# Patient Record
Sex: Female | Born: 1965 | Race: White | Hispanic: No | Marital: Married | Smoking: Never smoker
Health system: Southern US, Community
[De-identification: ages and names within clinical notes are randomized; demographics above are authoritative.]

## PROBLEM LIST (undated history)

## (undated) DIAGNOSIS — R519 Headache, unspecified: Secondary | ICD-10-CM

## (undated) DIAGNOSIS — I499 Cardiac arrhythmia, unspecified: Secondary | ICD-10-CM

## (undated) DIAGNOSIS — D649 Anemia, unspecified: Secondary | ICD-10-CM

## (undated) DIAGNOSIS — K219 Gastro-esophageal reflux disease without esophagitis: Secondary | ICD-10-CM

## (undated) DIAGNOSIS — Z8719 Personal history of other diseases of the digestive system: Secondary | ICD-10-CM

## (undated) DIAGNOSIS — G4730 Sleep apnea, unspecified: Secondary | ICD-10-CM

## (undated) DIAGNOSIS — E042 Nontoxic multinodular goiter: Secondary | ICD-10-CM

## (undated) DIAGNOSIS — A6920 Lyme disease, unspecified: Secondary | ICD-10-CM

## (undated) DIAGNOSIS — R51 Headache: Secondary | ICD-10-CM

## (undated) DIAGNOSIS — M1990 Unspecified osteoarthritis, unspecified site: Secondary | ICD-10-CM

## (undated) DIAGNOSIS — Z87442 Personal history of urinary calculi: Secondary | ICD-10-CM

## (undated) DIAGNOSIS — F4310 Post-traumatic stress disorder, unspecified: Secondary | ICD-10-CM

## (undated) DIAGNOSIS — G459 Transient cerebral ischemic attack, unspecified: Secondary | ICD-10-CM

## (undated) DIAGNOSIS — C801 Malignant (primary) neoplasm, unspecified: Secondary | ICD-10-CM

## (undated) HISTORY — PX: NASAL SEPTUM SURGERY: SHX37

## (undated) HISTORY — DX: Gastro-esophageal reflux disease without esophagitis: K21.9

## (undated) HISTORY — PX: ABDOMINAL HYSTERECTOMY: SHX81

## (undated) HISTORY — PX: OTHER SURGICAL HISTORY: SHX169

## (undated) HISTORY — PX: LUMBAR FUSION: SHX111

## (undated) HISTORY — PX: CERVICAL FUSION: SHX112

## (undated) HISTORY — PX: TONSILLECTOMY: SUR1361

## (undated) HISTORY — PX: CHOLECYSTECTOMY: SHX55

---

## 2009-12-09 DIAGNOSIS — G459 Transient cerebral ischemic attack, unspecified: Secondary | ICD-10-CM

## 2009-12-09 DIAGNOSIS — C801 Malignant (primary) neoplasm, unspecified: Secondary | ICD-10-CM

## 2009-12-09 HISTORY — PX: OTHER SURGICAL HISTORY: SHX169

## 2009-12-09 HISTORY — DX: Malignant (primary) neoplasm, unspecified: C80.1

## 2009-12-09 HISTORY — DX: Transient cerebral ischemic attack, unspecified: G45.9

## 2015-12-10 DIAGNOSIS — Z87442 Personal history of urinary calculi: Secondary | ICD-10-CM

## 2015-12-10 HISTORY — DX: Personal history of urinary calculi: Z87.442

## 2016-12-20 ENCOUNTER — Other Ambulatory Visit (HOSPITAL_COMMUNITY): Payer: Self-pay | Admitting: Surgery

## 2016-12-30 ENCOUNTER — Ambulatory Visit (HOSPITAL_COMMUNITY)
Admission: RE | Admit: 2016-12-30 | Discharge: 2016-12-30 | Disposition: A | Discharge status: Home / Self Care | Payer: Medicare Other | Source: Ambulatory Visit | Attending: Surgery | Admitting: Surgery

## 2016-12-30 ENCOUNTER — Other Ambulatory Visit (HOSPITAL_COMMUNITY): Payer: Self-pay | Admitting: Surgery

## 2016-12-30 ENCOUNTER — Other Ambulatory Visit (HOSPITAL_COMMUNITY)
Admission: RE | Admit: 2016-12-30 | Discharge: 2016-12-30 | Disposition: A | Discharge status: Home / Self Care | Payer: Medicare Other | Source: Ambulatory Visit | Attending: Surgery | Admitting: Surgery

## 2016-12-30 ENCOUNTER — Other Ambulatory Visit: Payer: Self-pay

## 2016-12-30 DIAGNOSIS — K219 Gastro-esophageal reflux disease without esophagitis: Secondary | ICD-10-CM | POA: Diagnosis not present

## 2016-12-30 DIAGNOSIS — Z01812 Encounter for preprocedural laboratory examination: Secondary | ICD-10-CM | POA: Insufficient documentation

## 2016-12-30 DIAGNOSIS — I517 Cardiomegaly: Secondary | ICD-10-CM | POA: Insufficient documentation

## 2016-12-30 DIAGNOSIS — Z01818 Encounter for other preprocedural examination: Secondary | ICD-10-CM | POA: Insufficient documentation

## 2016-12-30 LAB — CBC WITH DIFFERENTIAL/PLATELET
Basophils Absolute: 0 10*3/uL (ref 0.0–0.1)
Basophils Relative: 0 %
Eosinophils Absolute: 0.1 10*3/uL (ref 0.0–0.7)
Eosinophils Relative: 1 %
HCT: 37.8 % (ref 36.0–46.0)
Hemoglobin: 12 g/dL (ref 12.0–15.0)
LYMPHS ABS: 2.4 10*3/uL (ref 0.7–4.0)
LYMPHS PCT: 28 %
MCH: 29.1 pg (ref 26.0–34.0)
MCHC: 31.7 g/dL (ref 30.0–36.0)
MCV: 91.7 fL (ref 78.0–100.0)
MONO ABS: 0.7 10*3/uL (ref 0.1–1.0)
MONOS PCT: 8 %
NEUTROS ABS: 5.4 10*3/uL (ref 1.7–7.7)
Neutrophils Relative %: 63 %
Platelets: 295 10*3/uL (ref 150–400)
RBC: 4.12 MIL/uL (ref 3.87–5.11)
RDW: 13.9 % (ref 11.5–15.5)
WBC: 8.6 10*3/uL (ref 4.0–10.5)

## 2016-12-30 LAB — COMPREHENSIVE METABOLIC PANEL
ALT: 18 U/L (ref 14–54)
ANION GAP: 8 (ref 5–15)
AST: 16 U/L (ref 15–41)
Albumin: 3.5 g/dL (ref 3.5–5.0)
Alkaline Phosphatase: 65 U/L (ref 38–126)
BUN: 18 mg/dL (ref 6–20)
CALCIUM: 8.6 mg/dL — AB (ref 8.9–10.3)
CO2: 29 mmol/L (ref 22–32)
Chloride: 103 mmol/L (ref 101–111)
Creatinine, Ser: 0.98 mg/dL (ref 0.44–1.00)
GFR calc Af Amer: >60 mL/min (ref >60)
Glucose, Bld: 90 mg/dL (ref 65–99)
POTASSIUM: 4.2 mmol/L (ref 3.5–5.1)
Sodium: 140 mmol/L (ref 135–145)
TOTAL PROTEIN: 6.6 g/dL (ref 6.5–8.1)
Total Bilirubin: 0.7 mg/dL (ref 0.3–1.2)

## 2016-12-30 LAB — IRON: IRON: 39 ug/dL (ref 28–170)

## 2016-12-30 LAB — LIPID PANEL
Cholesterol: 209 mg/dL — ABNORMAL HIGH (ref 0–200)
HDL: 71 mg/dL (ref >40)
LDL Cholesterol: 113 mg/dL — ABNORMAL HIGH (ref 0–99)
Total CHOL/HDL Ratio: 2.9 RATIO
Triglycerides: 126 mg/dL (ref <150)
VLDL: 25 mg/dL (ref 0–40)

## 2016-12-30 LAB — TSH: TSH: 0.275 u[IU]/mL — AB (ref 0.350–4.500)

## 2016-12-30 LAB — VITAMIN B12: Vitamin B-12: 772 pg/mL (ref 180–914)

## 2016-12-31 LAB — HEMOGLOBIN A1C
HEMOGLOBIN A1C: 5.4 % (ref 4.8–5.6)
Mean Plasma Glucose: 108 mg/dL

## 2016-12-31 LAB — VITAMIN D 25 HYDROXY (VIT D DEFICIENCY, FRACTURES): Vit D, 25-Hydroxy: 38.2 ng/mL (ref 30.0–100.0)

## 2016-12-31 LAB — MISC LABCORP TEST (SEND OUT): Labcorp test code: 163170

## 2017-01-01 LAB — VITAMIN B1: Vitamin B1 (Thiamine): 151.1 nmol/L (ref 66.5–200.0)

## 2017-01-03 LAB — VITAMIN D 1,25 DIHYDROXY
VITAMIN D3 1, 25 (OH): 37 pg/mL
Vitamin D 1, 25 (OH)2 Total: 38 pg/mL

## 2017-01-08 ENCOUNTER — Encounter: Payer: Self-pay | Admitting: Skilled Nursing Facility1

## 2017-01-08 ENCOUNTER — Encounter: Payer: Medicare Other | Attending: Surgery | Admitting: Skilled Nursing Facility1

## 2017-01-08 DIAGNOSIS — Z79899 Other long term (current) drug therapy: Secondary | ICD-10-CM | POA: Diagnosis not present

## 2017-01-08 DIAGNOSIS — E669 Obesity, unspecified: Secondary | ICD-10-CM

## 2017-01-08 DIAGNOSIS — Z6841 Body Mass Index (BMI) 40.0 and over, adult: Secondary | ICD-10-CM | POA: Diagnosis not present

## 2017-01-08 DIAGNOSIS — Z85528 Personal history of other malignant neoplasm of kidney: Secondary | ICD-10-CM | POA: Diagnosis not present

## 2017-01-08 DIAGNOSIS — Z905 Acquired absence of kidney: Secondary | ICD-10-CM | POA: Insufficient documentation

## 2017-01-08 DIAGNOSIS — Z713 Dietary counseling and surveillance: Secondary | ICD-10-CM | POA: Insufficient documentation

## 2017-01-08 DIAGNOSIS — Z8673 Personal history of transient ischemic attack (TIA), and cerebral infarction without residual deficits: Secondary | ICD-10-CM | POA: Diagnosis not present

## 2017-01-08 DIAGNOSIS — K219 Gastro-esophageal reflux disease without esophagitis: Secondary | ICD-10-CM | POA: Diagnosis not present

## 2017-01-08 DIAGNOSIS — G4730 Sleep apnea, unspecified: Secondary | ICD-10-CM | POA: Diagnosis not present

## 2017-01-08 NOTE — Patient Instructions (Signed)
Follow Pre-Op Goals Try Protein Shakes Call [facility] at [phone] when surgery is scheduled to enroll in Pre-Op Class  Things to remember:  Please always be honest with [place]. We want to support you!  If you have any questions or concerns in between appointments, please call or email   The diet after surgery will be high protein and low in carbohydrate.  Vitamins and calcium need to be taken for the rest of your life.  Feel free to include support people in any classes or appointments.   Supplement recommendations:  Before Surgery   1 Complete Multivitamin with Iron  3000 IU Vitamin D3  After Surgery   2 Chewable Multivitamins  **Best Choice - Bariatric Advantage Advanced Multi EA      3 Chewable Calcium (500 mg each, total 1200-1500 mg per day)  **Best Choice - Celebrate, Bariatric Advantage, or Wellesse  Other Options:    2 Flinstones Complete + up to 100 mg Thiamin + 2000-3000 IU Vitamin D3 + 350-500 mcg Vitamin B12 + 30-45 mg Iron (with history of deficiency)  2 Celebrate MultiComplete with 18 mg Iron (this provides 6000 IU of  Vitamin D3)  4 Celebrate Essential Multi 2 in 1 (has calcium) + 18-60 mg separate  iron  Vitamins and Calcium are available at:   [facility]   [address] [place], [place] [address]   [email or web address]  [email or web address]  www.amazon.com

## 2017-01-08 NOTE — Progress Notes (Signed)
Pre-Op Assessment Visit:  Pre-Operative Sleeve Gastrectomy Surgery  Medical Nutrition Therapy:  Appt start time: 3:15  End time:  4:00  Patient was seen on 01/08/2017 nfor Pre-Operative Nutrition Assessment. Assessment and letter of approval faxed to [facility] Bariatric Surgery Program coordinator on 01/08/2017.  Pt states she has severe GERD and is concerned about the sleeve gastrectomy being the right choice for her so she will ask her surgeon about this. 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 Pt states she had lyme disease and [place] spotted fever. Pt states she has had a stroke. Pt states she was really nervous for this appointment because she did not know what to expect. Pt chose a couple things to work on for her next appointment.  Start weight at [facility]: 261.9 pounds Ht: 63.75 in BMI: 45.31  24 hr Dietary Recall: First Meal: oatmeal----cereal Snack:  Second Meal: tomato sandwich Snack: carrots Third Meal: baked chicken Snack: nutter butter Beverages: water  Encouraged to engage in 45 minutes of moderate physical activity including cardiovascular and weight baring weekly  Handouts given during visit include:  . Pre-Op Goals . Bariatric Surgery Protein Shakes During the appointment today the following Pre-Op Goals were reviewed with the patient: . Maintain or lose weight as instructed by your surgeon . Make healthy food choices . Begin to limit portion sizes . Limited concentrated sugars and fried foods . Keep fat/sugar in the single digits per serving on          food labels . Practice CHEWING your food  (aim for 30 chews per bite or until applesauce consistency) . Practice not drinking 15 minutes before, during, and 30 minutes after each meal/snack . Avoid all carbonated beverages  . Avoid/limit caffeinated beverages  . Avoid all sugar-sweetened beverages . Consume 3 meals per day; eat every 3-5 hours . Make a list of non-food related  activities . Aim for 64-100 ounces of FLUID daily  . Aim for at least 60-80 grams of PROTEIN daily . Look for a liquid protein source that contain ?15 g protein and ?5 g carbohydrate  (ex: shakes, drinks, shots)  -Follow diet recommendations listed below   Energy and Macronutrient Recomendations: Calories: 1500 Carbohydrate: 170 Protein: 112 Fat: 42  Demonstrated degree of understanding via:  Teach Back  Teaching Method Utilized:  Visual Auditory Hands on  Barriers to learning/adherence to lifestyle change: none identified   Patient to call the [facility] to enroll in Pre-Op and Post-Op Nutrition Education when surgery date is scheduled.

## 2017-02-05 ENCOUNTER — Encounter: Payer: Medicare Other | Attending: Surgery | Admitting: Skilled Nursing Facility1

## 2017-02-05 ENCOUNTER — Encounter: Payer: Self-pay | Admitting: Skilled Nursing Facility1

## 2017-02-05 DIAGNOSIS — G4730 Sleep apnea, unspecified: Secondary | ICD-10-CM | POA: Insufficient documentation

## 2017-02-05 DIAGNOSIS — Z85528 Personal history of other malignant neoplasm of kidney: Secondary | ICD-10-CM | POA: Insufficient documentation

## 2017-02-05 DIAGNOSIS — Z905 Acquired absence of kidney: Secondary | ICD-10-CM | POA: Diagnosis not present

## 2017-02-05 DIAGNOSIS — Z8673 Personal history of transient ischemic attack (TIA), and cerebral infarction without residual deficits: Secondary | ICD-10-CM | POA: Insufficient documentation

## 2017-02-05 DIAGNOSIS — Z713 Dietary counseling and surveillance: Secondary | ICD-10-CM | POA: Diagnosis not present

## 2017-02-05 DIAGNOSIS — Z79899 Other long term (current) drug therapy: Secondary | ICD-10-CM | POA: Diagnosis not present

## 2017-02-05 DIAGNOSIS — E669 Obesity, unspecified: Secondary | ICD-10-CM

## 2017-02-05 DIAGNOSIS — K219 Gastro-esophageal reflux disease without esophagitis: Secondary | ICD-10-CM | POA: Insufficient documentation

## 2017-02-05 DIAGNOSIS — Z6841 Body Mass Index (BMI) 40.0 and over, adult: Secondary | ICD-10-CM | POA: Diagnosis not present

## 2017-02-05 NOTE — Progress Notes (Signed)
  Start weight at [facility]: 261.9 pounds Wt: 255.12.8 BMI: 45.31  Assessment:   1st [facility] Appointment. Pt arrives losing about 5 pounds. Pt states she has been working on chewing, staying away from fried foods and trying to increase water intake. Pt staes she can get in 2 16.9 ounces of water in. avoid fried food gone well and bought premier protein. Chewing has been going well. Thinking about changeing to bypass due to GERD.  Pt states she has stopped a Medication causing increased weight gain.  MEDICATIONS: see list   DIETARY INTAKE:  24-hr recall:  B ( AM): oatmeal or egg sandwich Snk ( AM): banana  L ( PM):  Snk ( PM):  D ( PM): grilled steak Snk ( PM):  Beverages: water, sprite  Usual physical activity: treadmill and bike at home: 6 minutes 3 days a week   Diet to Follow: 1500 calories 170 g carbohydrates 112 g protein 42 g fat   Nutritional Diagnosis:  [place]-3.3 Overweight/obesity related to past poor dietary habits and physical inactivity as evidenced by patient w/ recent sleeve gastrectomy surgery following dietary guidelines for continued weight loss.    Intervention:  Nutrition counseling for upcoming Bariatric Surgery. Goals: -Keep working on chewing  -Keep working on adding the water -Keep your activity up -Decrease/cut out all sugar sweetened/carbonated beverages  Teaching Method Utilized:  Visual Auditory Hands on Barriers to learning/adherence to lifestyle change: none identified   Demonstrated degree of understanding via:  Teach Back   Monitoring/Evaluation:  Dietary intake, exercise, and body weight prn.

## 2017-02-05 NOTE — Patient Instructions (Signed)
-  Keep working on chewing   -Keep working on adding the water  -Keep your activity up  -Decrease/cut out all sugar sweetened/carbonated beverages

## 2017-03-06 ENCOUNTER — Encounter: Payer: Medicare Other | Attending: Surgery | Admitting: Skilled Nursing Facility1

## 2017-03-06 ENCOUNTER — Encounter: Payer: Self-pay | Admitting: Skilled Nursing Facility1

## 2017-03-06 DIAGNOSIS — Z905 Acquired absence of kidney: Secondary | ICD-10-CM | POA: Diagnosis not present

## 2017-03-06 DIAGNOSIS — Z713 Dietary counseling and surveillance: Secondary | ICD-10-CM | POA: Insufficient documentation

## 2017-03-06 DIAGNOSIS — Z79899 Other long term (current) drug therapy: Secondary | ICD-10-CM | POA: Diagnosis not present

## 2017-03-06 DIAGNOSIS — K219 Gastro-esophageal reflux disease without esophagitis: Secondary | ICD-10-CM | POA: Insufficient documentation

## 2017-03-06 DIAGNOSIS — Z8673 Personal history of transient ischemic attack (TIA), and cerebral infarction without residual deficits: Secondary | ICD-10-CM | POA: Diagnosis not present

## 2017-03-06 DIAGNOSIS — Z85528 Personal history of other malignant neoplasm of kidney: Secondary | ICD-10-CM | POA: Diagnosis not present

## 2017-03-06 DIAGNOSIS — G4730 Sleep apnea, unspecified: Secondary | ICD-10-CM | POA: Diagnosis not present

## 2017-03-06 DIAGNOSIS — Z6841 Body Mass Index (BMI) 40.0 and over, adult: Secondary | ICD-10-CM | POA: Insufficient documentation

## 2017-03-06 DIAGNOSIS — E669 Obesity, unspecified: Secondary | ICD-10-CM

## 2017-03-06 NOTE — Progress Notes (Signed)
  Start weight at [facility]: 261.9 pounds Wt: 254.3 BMI: 43.99  Assessment:   2nd SWL Appointment. Pt arrives having maintained weight. Pt states she has quit all carbonated drinks. Pt states she has replaced sweets with fresh fruit. Pt states that she has experienced GERD symptoms more this wk. Pt states she has increased her movement. Pt states that she thinks she may have 4 visits left. Pt states that she has increased her physical activity from 6 min to 11 min. Pt states that she will call [facility] to see if they will submit to her insurance at this point.  MEDICATIONS: see list   DIETARY INTAKE:  24-hr recall:  B ( AM): oatmeal or egg sandwich Snk ( AM): banana L ( PM): sandwich Snk ( PM): fruit D ( PM): grilled steak Snk ( PM): grapes, strawberries Beverages: water, cherry or apple juice  Usual physical activity: treadmill and bike at home: 11 minutes 5 days a week   Diet to Follow: 1500 calories 170 g carbohydrates 112 g protein 42 g fat   Nutritional Diagnosis:  [place]-3.3 Overweight/obesity related to past poor dietary habits and physical inactivity as evidenced by patient w/ recent sleeve gastrectomy surgery following dietary guidelines for continued weight loss.    Intervention:  Nutrition counseling for upcoming Bariatric Surgery. Goals: - Decrease intake of cherry juice and monitor GERD to improve symptoms - Try alkaline water - Increase to 15-20 minutes on the treadmill - Totally eliminate juice from diet  Teaching Method Utilized:  Visual Auditory Hands on  Barriers to learning/adherence to lifestyle change: none identified   Demonstrated degree of understanding via:  Teach Back   Monitoring/Evaluation:  Dietary intake, exercise, and body weight prn.

## 2017-03-06 NOTE — Patient Instructions (Addendum)
-   Decrease intake of cherry juice and monitor GERD to improve symptoms - Try alkaline water - Increase to 15-20 minutes on the treadmill - Totally eliminate juice from diet

## 2017-03-27 ENCOUNTER — Ambulatory Visit (INDEPENDENT_AMBULATORY_CARE_PROVIDER_SITE_OTHER): Payer: Medicare Other | Admitting: Psychiatry

## 2017-03-27 DIAGNOSIS — F509 Eating disorder, unspecified: Secondary | ICD-10-CM | POA: Diagnosis not present

## 2017-03-27 DIAGNOSIS — F4310 Post-traumatic stress disorder, unspecified: Secondary | ICD-10-CM

## 2017-04-01 ENCOUNTER — Encounter: Payer: Medicare Other | Attending: Surgery | Admitting: Registered"

## 2017-04-01 ENCOUNTER — Ambulatory Visit (INDEPENDENT_AMBULATORY_CARE_PROVIDER_SITE_OTHER): Payer: Medicare Other | Admitting: Psychiatry

## 2017-04-01 ENCOUNTER — Encounter: Payer: Self-pay | Admitting: Registered"

## 2017-04-01 DIAGNOSIS — F509 Eating disorder, unspecified: Secondary | ICD-10-CM

## 2017-04-01 DIAGNOSIS — Z713 Dietary counseling and surveillance: Secondary | ICD-10-CM | POA: Insufficient documentation

## 2017-04-01 DIAGNOSIS — F4310 Post-traumatic stress disorder, unspecified: Secondary | ICD-10-CM | POA: Diagnosis not present

## 2017-04-01 DIAGNOSIS — Z905 Acquired absence of kidney: Secondary | ICD-10-CM | POA: Insufficient documentation

## 2017-04-01 DIAGNOSIS — K219 Gastro-esophageal reflux disease without esophagitis: Secondary | ICD-10-CM | POA: Insufficient documentation

## 2017-04-01 DIAGNOSIS — Z8673 Personal history of transient ischemic attack (TIA), and cerebral infarction without residual deficits: Secondary | ICD-10-CM | POA: Insufficient documentation

## 2017-04-01 DIAGNOSIS — Z85528 Personal history of other malignant neoplasm of kidney: Secondary | ICD-10-CM | POA: Insufficient documentation

## 2017-04-01 DIAGNOSIS — Z6841 Body Mass Index (BMI) 40.0 and over, adult: Secondary | ICD-10-CM | POA: Insufficient documentation

## 2017-04-01 DIAGNOSIS — E669 Obesity, unspecified: Secondary | ICD-10-CM

## 2017-04-01 DIAGNOSIS — G4730 Sleep apnea, unspecified: Secondary | ICD-10-CM | POA: Insufficient documentation

## 2017-04-01 DIAGNOSIS — Z79899 Other long term (current) drug therapy: Secondary | ICD-10-CM | POA: Insufficient documentation

## 2017-04-01 NOTE — Patient Instructions (Signed)
-   Continue to increase physical activity on treadmill 15-20 minutes 4 times a wk  - Increase non-starchy vegetables daily (greens, cabbage, asparagus, carrots)  - Continue to increase fluid continue to 64 oz per day

## 2017-04-01 NOTE — Progress Notes (Signed)
Appt start time: 3:50 end time: 4:24 Assessment: 3rd SWL Appointment   Start Wt at [facility]: 261.9 Wt: 253.1 BMI: 43.79  Pt arrives having lost about 1 lb from previous visit. Pt admits she's eating well but feeling very tired. Pt reports she has eliminated cherry juice and other juices from diet and GERD has improved. Pt states she has maintained amount of time on treadmill but has added in strength training.   Pt states that she needs 2 more visits prior to surgery.   Preferred Learning Style:   Auditory  Visual  No preference indicated   Learning Readiness:   Ready  Change in progress  MEDICATIONS: See medication list   DIETARY INTAKE:  24-hr recall:  B ( AM): 1/2 egg, burrito Snk ( AM): grapes, goldfish  L ( PM): 1/2 bowl tuna, egg, ritz crackers Snk ( PM): peanut butter sandwich D ( PM): taco w/ lettuce rice and beans Snk ( PM): grapes Beverages: water  Usual physical activity: 4 times per week with treadmill  Diet to Follow: 1500 calories 170 g carbohydrates 112 g protein 42 g fat   Nutritional Diagnosis:  [place]-3.3 Overweight/obesity related to past poor dietary habits and physical inactivity as evidenced by patient w/ recent sleeve gastrectomy surgery following dietary guidelines for continued weight loss.    Intervention:  Nutrition counseling for upcoming Bariatric Surgery.  Goals:  - Continue to increase physical activity on treadmill 15-20 minutes 4 times a wk - Increase non-starchy vegetables daily (greens, cabbage, asparagus, carrots) - Continue to increase fluid continue to 64 oz per day  Teaching Method Utilized:  Visual Auditory  Handouts given during visit include: none  Barriers to learning/adherence to lifestyle change: none identified  Demonstrated degree of understanding via: Teach Back   Monitoring/Evaluation:  Dietary intake, exercise, and body weight in 1 month(s).

## 2017-04-02 ENCOUNTER — Ambulatory Visit: Payer: Medicare Other | Admitting: Registered"

## 2017-04-23 ENCOUNTER — Encounter: Payer: Medicare Other | Attending: Surgery | Admitting: Registered"

## 2017-04-23 ENCOUNTER — Encounter: Payer: Self-pay | Admitting: Registered"

## 2017-04-23 DIAGNOSIS — E669 Obesity, unspecified: Secondary | ICD-10-CM

## 2017-04-23 DIAGNOSIS — Z79899 Other long term (current) drug therapy: Secondary | ICD-10-CM | POA: Diagnosis not present

## 2017-04-23 DIAGNOSIS — K219 Gastro-esophageal reflux disease without esophagitis: Secondary | ICD-10-CM | POA: Diagnosis not present

## 2017-04-23 DIAGNOSIS — Z6841 Body Mass Index (BMI) 40.0 and over, adult: Secondary | ICD-10-CM | POA: Diagnosis not present

## 2017-04-23 DIAGNOSIS — Z713 Dietary counseling and surveillance: Secondary | ICD-10-CM | POA: Diagnosis not present

## 2017-04-23 DIAGNOSIS — Z905 Acquired absence of kidney: Secondary | ICD-10-CM | POA: Diagnosis not present

## 2017-04-23 DIAGNOSIS — G4730 Sleep apnea, unspecified: Secondary | ICD-10-CM | POA: Insufficient documentation

## 2017-04-23 DIAGNOSIS — Z85528 Personal history of other malignant neoplasm of kidney: Secondary | ICD-10-CM | POA: Diagnosis not present

## 2017-04-23 DIAGNOSIS — Z8673 Personal history of transient ischemic attack (TIA), and cerebral infarction without residual deficits: Secondary | ICD-10-CM | POA: Diagnosis not present

## 2017-04-23 NOTE — Progress Notes (Signed)
Appt start time: 4:30 end time: 4:49 Assessment: 4th SWL Appointment   Start Wt at [facility]: 261.9 Wt: 253.6 BMI: 43.79   Pt arrives having maintained weight from previous visit. Pt states she thinks she injured her shoulder working out on a Engineer, water. Pt states her injuries have hindered her from being able to workout. Pt reports currently getting infusions in her neck and back. Pt states she has not been able to use treadmill as well. Pt reports cutting out all sweets except juices. Pt reports she hasn't been adding in many vegetables yet.   Pt states at her last visit, set up pre-op class. Pt states that she needs 1 more visits prior to surgery and will verify.  Preferred Learning Style:   Auditory  Visual  No preference indicated   Learning Readiness:   Ready  Change in progress  MEDICATIONS: See medication list   DIETARY INTAKE:  24-hr recall:  B ( AM): scrambled eggs, [place], onion Snk ( AM): grapes, goldfish  L ( PM): 1/2 bowl tuna, ritz crackers Snk ( PM): none D ( PM): flounder filet, cole slaw, cheese toast Snk ( PM): none Beverages: water, juice  Usual physical activity: 4 times per week with treadmill  Diet to Follow: 1500 calories 170 g carbohydrates 112 g protein 42 g fat   Nutritional Diagnosis:  [place]-3.3 Overweight/obesity related to past poor dietary habits and physical inactivity as evidenced by patient w/ upcoming sleeve gastrectomy surgery following dietary guidelines for continued weight loss.    Intervention:  Nutrition counseling for upcoming Bariatric Surgery.  Goals:  - Continue to aim for 64 oz of fluid a day. - Aim to increase physical activity once shoulder pain improves.    Teaching Method Utilized:  Visual Auditory  Handouts given during visit include: none  Barriers to learning/adherence to lifestyle change: none identified  Demonstrated degree of understanding via: Teach Back   Monitoring/Evaluation:  Dietary  intake, exercise, and body weight in 21 day(s).

## 2017-04-23 NOTE — Patient Instructions (Addendum)
-   Continue to aim for 64 oz of fluid a day.  - Aim to increase physical activity once shoulder pain improves.

## 2017-04-28 ENCOUNTER — Encounter: Payer: Medicare Other | Admitting: Skilled Nursing Facility1

## 2017-04-28 ENCOUNTER — Encounter: Payer: Self-pay | Admitting: Skilled Nursing Facility1

## 2017-04-28 DIAGNOSIS — E669 Obesity, unspecified: Secondary | ICD-10-CM

## 2017-04-28 NOTE — Progress Notes (Signed)
Please place orders in EPIC as patient is being scheduled for a pre-op appointment! Thank you! 

## 2017-04-28 NOTE — Progress Notes (Signed)
  Pre-Operative Nutrition Class:  Appt start time: 3893   End time:  1830.  Patient was seen on 04/28/17 for Pre-Operative Bariatric Surgery [facility].   Surgery date:  Surgery type: Sleeve Start weight at [facility]: 261 lbs Weight today: 255 lbs  TANITA  BODY COMP RESULTS     BMI (kg/m^2)    Fat Mass (lbs)    Fat Free Mass (lbs)    Total Body Water (lbs)    Samples given per MNT protocol. Patient educated on appropriate usage: Bariatric Advantage Multivitamin Lot # [id number] Exp: 06/19  Bariatric Advantage Calcium Citrate Lot # [id number] Exp: 08/17/17  Premier Protein Lot # [id number] Exp: 10/11/17  The following the learning objectives were met by the patient during this course:  Identify Pre-Op Dietary Goals and will begin 2 weeks pre-operatively  Identify appropriate sources of fluids and proteins   State protein recommendations and appropriate sources pre and post-operatively  Identify Post-Operative Dietary Goals and will follow for 2 weeks post-operatively  Identify appropriate multivitamin and calcium sources  Describe the need for physical activity post-operatively and will follow MD recommendations  State when to call healthcare provider regarding medication questions or post-operative complications  Handouts given during class include:  Pre-Op Bariatric Surgery Diet Handout  Protein Shake Handout  Post-Op Bariatric Surgery Nutrition Handout  [facility] Program Information Flyer  Support Group Information Flyer  [facility] Bariatric Supplements Price List  Follow-Up Plan: Patient will follow-up at [facility] 2 weeks post operatively for diet advancement per MD.

## 2017-04-29 NOTE — Progress Notes (Signed)
Please place orders in EPIC as patient has a pre-op appointment on 05/06/2017! Thank you!

## 2017-05-01 ENCOUNTER — Ambulatory Visit: Payer: Self-pay | Admitting: Surgery

## 2017-05-01 NOTE — H&P (Signed)
Pamela Pope Location: [facility] Patient #: [id number] DOB: 09-Dec-1966 Married / Language: English / Race: White Female   History of Present Illness  The patient is a 51 year old female who presents for a bariatric surgery evaluation. Symptoms include excess weight, inability to lose weight and knee pain. Her primary care physician is Dr. Harlow Mares at [facility].  She lives in [place].  BMI over the last 5 years ranges from 39 to ~44. She has tried Fen/Phen, Weight watchers, slimfast, and low calorie diets and Adipex. 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 Her grandmother had gastric bypass in the 38s and did well.   She has provided a good note indicating her reasons for having bariatric surgery. I have discussed sleeve gastrectomy and gastric bypass with her in detail. She and her husband decided to pursue sleeve gastrectomy. She is aware of the risks and complications. She has had left nephrectomy for renal cell carcinoma. She has a sliding hiatal hernia and GERD that only responds to Aciphex.  I told her that we would try to repair her hiatal hernia (seen on outside films).  She is aware of her risks since she has had left nephrectomy  Will move forward with workup.    Past Surgical History  Breast Biopsy  Right. multiple Breast Mass; Local Excision  Right. Cesarean Section - Multiple  Colon Polyp Removal - Colonoscopy  Foot Surgery  Right. Gallbladder Surgery - Laparoscopic  Hysterectomy (not due to cancer) - Complete  Nephrectomy  Left. Oral Surgery  Shoulder Surgery  Right. Spinal Surgery - Lower Back  Spinal Surgery - Neck  Tonsillectomy   Diagnostic Studies History  Colonoscopy  1-5 years ago Mammogram  within last year Pap Smear  1-5 years ago  Allergies  Sulfa 10 *OPHTHALMIC AGENTS*  shortness of breath, itching Septa *DERMATOLOGICALS*  shortness  of breath, itching Docu *LAXATIVES*  Itching Allergies Reconciled   Medication History ( Carvedilol (25MG  Tablet, Oral twice a day) Active. Aciphex (20MG  Tablet DR, Oral daily) Active. Premarin (0.9MG  Tablet, Oral daily) Active. Midodrine HCl (5MG  Tablet, Oral threeq) Active. (Three times a day as needed.) Escitalopram Oxalate (20MG  Tablet, Oral daily) Active. Rexulti (2MG  Tablet, Oral daily) Active. Gabapentin (300MG  Capsule, Oral daily) Active. TraZODone HCl (100MG  Tablet, Oral daily) Active. Flexeril (10MG  Tablet, Oral as needed) Active. (three times a day as needed.) Imitrex STATdose Refill (6MG /0.5ML Soln Cartridge, Subcutaneous as needed) Active. OxyCODONE HCl (10MG  Tablet, Oral as needed) Active. Complete Multi-Vitamin (Oral daily) Active. True aloa - Daily (OTC daily) Active. Biotin 5000 (5MG  Capsule, Oral daily) Active. Probiotic + Omega-3 (Oral daily) Active. Zofran (4MG  Tablet, Oral as needed) Active. Medications Reconciled  Social History  Caffeine use  Carbonated beverages. Illicit drug use  Prefer to discuss with provider. No alcohol use  Tobacco use  Never smoker.  Family History  Alcohol Abuse  Mother, Sister. Arthritis  Mother. Breast Cancer  Family Members In General, Mother. Kidney Disease  Mother.  Pregnancy / Birth History  Age at menarche  20 years. Gravida  3 Maternal age  63-20 Para  3  Other Problems  Anxiety Disorder  Arthritis  Back Pain  Bladder Problems  Cancer  Cerebrovascular Accident  Cholelithiasis  Diverticulosis  Gastroesophageal Reflux Disease  Hemorrhoids  High blood pressure  Kidney Stone  Migraine Headache  Oophorectomy  Bilateral. Sleep Apnea  Transfusion history  Ventral Hernia Repair     Review of Systems General  Present- Fatigue and Weight Gain. Not Present- Appetite Loss, Chills, Fever, Night Sweats and Weight Loss. HEENT Present- Hearing Loss, Ringing in the  Ears, Seasonal Allergies and Wears glasses/contact lenses. Not Present- Earache, Hoarseness, Nose Bleed, Oral Ulcers, Sinus Pain, Sore Throat, Visual Disturbances and Yellow Eyes. Respiratory Present- Snoring and Wheezing. Not Present- Bloody sputum, Chronic Cough and Difficulty Breathing. Breast Not Present- Breast Mass, Breast Pain, Nipple Discharge and Skin Changes. Cardiovascular Present- Rapid Heart Rate. Not Present- Chest Pain, Difficulty Breathing Lying Down, Leg Cramps, Palpitations, Shortness of Breath and Swelling of Extremities. Gastrointestinal Present- Bloating, Excessive gas, Hemorrhoids, Indigestion and Nausea. Not Present- Abdominal Pain, Bloody Stool, Change in Bowel Habits, Chronic diarrhea, Constipation, Difficulty Swallowing, Gets full quickly at meals, Rectal Pain and Vomiting. Female Genitourinary Present- Frequency and Urgency. Not Present- Nocturia, Painful Urination and Pelvic Pain. Musculoskeletal Present- Back Pain, Joint Pain, Joint Stiffness, Muscle Pain and Muscle Weakness. Not Present- Swelling of Extremities. Neurological Present- Headaches and Tingling. Not Present- Decreased Memory, Fainting, Numbness, Seizures, Tremor, Trouble walking and Weakness. Psychiatric Present- Anxiety and Change in Sleep Pattern. Not Present- Bipolar, Depression, Fearful and Frequent crying. Endocrine Present- Cold Intolerance, Hair Changes, Heat Intolerance and Hot flashes. Not Present- Excessive Hunger and New Diabetes. Hematology Present- Easy Bruising. Not Present- Blood Thinners, Excessive bleeding, Gland problems, HIV and Persistent Infections.  Vitals  12/19/2016 3:18 PM Weight: 266.8 lb Height: 65in Body Surface Area: 2.24 m Body Mass Index: 44.4 kg/m  Temp.: 48F  Pulse: 106 (Regular)  BP: 132/88 (Sitting, Left Arm, Standard)  Physical Exam  General Obese WF NAD Note: HEENT unremarkable Neck supple without bruits Chest clear Heart SR without murmurs Abdomen  left upper quadrant incision-kidney removed from renal cell cancer GU Ext arthritis; no DVT Neuro alert and oriented x 3 with normal motor and sensory function     Assessment & Plan  MORBID OBESITY, UNSPECIFIED OBESITY TYPE (E66.01)  Plan sleeve gastrectomy with hiatal hernia repair.   Matt B. Hassell Done, MD, FACS

## 2017-05-01 NOTE — Progress Notes (Signed)
Please placed orders in epic for 6-4- surgery in epic pre op is 5-29

## 2017-05-02 NOTE — Patient Instructions (Addendum)
Pamela Pope  05/02/2017   Your procedure is scheduled on: 05-12-17  Report to [facility] Main  Entrance Take [facility]  elevators to 3rd floor to  [place] AM.  Call this number if you have problems the morning of surgery [phone]   Remember: ONLY 1 PERSON MAY GO WITH YOU TO SHORT STAY TO GET  READY MORNING OF [place].  Do not eat food or drink liquids :After Midnight.     Take these medicines the morning of surgery with A SIP OF WATER: CARVEDILOL (COREG), CERTRIZINE (ZYRTEC), CYCLOBENZAPRINE (FLEXERIL)  IF NEEDED, ESCITALOPRAM  (LEXAPRO), MIDODRINE IF NEEDED, RABEPRAZOLE ( ACIPHEX), EYE DROP IF NEEDED              You may not have any metal on your body including hair pins and              piercings  Do not wear jewelry, make-up, lotions, powders or perfumes, deodorant             Do not wear nail polish.  Do not shave  48 hours prior to surgery.               Do not bring valuables to the hospital. [place].  Contacts, dentures or bridgework may not be worn into surgery.  Leave suitcase in the car. After surgery it may be brought to your room.                 Please read over the following fact sheets you were given: _____________________________________________________________________             [facility] - Preparing for Surgery Before surgery, you can play an important role.  Because skin is not sterile, your skin needs to be as free of germs as possible.  You can reduce the number of germs on your skin by washing with CHG (chlorahexidine gluconate) soap before surgery.  CHG is an antiseptic cleaner which kills germs and bonds with the skin to continue killing germs even after washing. Please DO NOT use if you have an allergy to CHG or antibacterial soaps.  If your skin becomes reddened/irritated stop using the CHG and inform your nurse when you arrive at Short Stay. Do not shave  (including legs and underarms) for at least 48 hours prior to the first CHG shower.  You may shave your face/neck. Please follow these instructions carefully:  1.  Shower with CHG Soap the night before surgery and the  morning of Surgery.  2.  If you choose to wash your hair, wash your hair first as usual with your  normal  shampoo.  3.  After you shampoo, rinse your hair and body thoroughly to remove the  shampoo.                           4.  Use CHG as you would any other liquid soap.  You can apply chg directly  to the skin and wash                       Gently with a scrungie or clean washcloth.  5.  Apply the CHG Soap to your body ONLY FROM THE  NECK DOWN.   Do not use on face/ open                           Wound or open sores. Avoid contact with eyes, ears mouth and genitals (private parts).                       Wash face,  Genitals (private parts) with your normal soap.             6.  Wash thoroughly, paying special attention to the area where your surgery  will be performed.  7.  Thoroughly rinse your body with warm water from the neck down.  8.  DO NOT shower/wash with your normal soap after using and rinsing off  the CHG Soap.                9.  Pat yourself dry with a clean towel.            10.  Wear clean pajamas.            11.  Place clean sheets on your bed the night of your first shower and do not  sleep with pets. Day of Surgery : Do not apply any lotions/deodorants the morning of surgery.  Please wear clean clothes to the hospital/surgery center.  FAILURE TO FOLLOW THESE INSTRUCTIONS MAY RESULT IN THE CANCELLATION OF YOUR SURGERY PATIENT SIGNATURE_________________________________  NURSE SIGNATURE__________________________________  ________________________________________________________________________   Adam Phenix  An incentive spirometer is a tool that can help keep your lungs clear and active. This tool measures how well you are filling your lungs with  each breath. Taking long deep breaths may help reverse or decrease the chance of developing breathing (pulmonary) problems (especially infection) following:  A long period of time when you are unable to move or be active. BEFORE THE PROCEDURE   If the spirometer includes an indicator to show your best effort, your nurse or respiratory therapist will set it to a desired goal.  If possible, sit up straight or lean slightly forward. Try not to slouch.  Hold the incentive spirometer in an upright position. INSTRUCTIONS FOR USE  1. Sit on the edge of your bed if possible, or sit up as far as you can in bed or on a chair. 2. Hold the incentive spirometer in an upright position. 3. Breathe out normally. 4. Place the mouthpiece in your mouth and seal your lips tightly around it. 5. Breathe in slowly and as deeply as possible, raising the piston or the ball toward the top of the column. 6. Hold your breath for 3-5 seconds or for as long as possible. Allow the piston or ball to fall to the bottom of the column. 7. Remove the mouthpiece from your mouth and breathe out normally. 8. Rest for a few seconds and repeat Steps 1 through 7 at least 10 times every 1-2 hours when you are awake. Take your time and take a few normal breaths between deep breaths. 9. The spirometer may include an indicator to show your best effort. Use the indicator as a goal to work toward during each repetition. 10. After each set of 10 deep breaths, practice coughing to be sure your lungs are clear. If you have an incision (the cut made at the time of surgery), support your incision when coughing by placing a pillow or rolled up towels firmly against it. Once you are able  to get out of bed, walk around indoors and cough well. You may stop using the incentive spirometer when instructed by your caregiver.  RISKS AND COMPLICATIONS  Take your time so you do not get dizzy or light-headed.  If you are in pain, you may need to take or  ask for pain medication before doing incentive spirometry. It is harder to take a deep breath if you are having pain. AFTER USE  Rest and breathe slowly and easily.  It can be helpful to keep track of a log of your progress. Your caregiver can provide you with a simple table to help with this. If you are using the spirometer at home, follow these instructions: [place] IF:   You are having difficultly using the spirometer.  You have trouble using the spirometer as often as instructed.  Your pain medication is not giving enough relief while using the spirometer.  You develop fever of 100.5 F (38.1 C) or higher. SEEK IMMEDIATE MEDICAL CARE IF:   You cough up bloody sputum that had not been present before.  You develop fever of 102 F (38.9 C) or greater.  You develop worsening pain at or near the incision site. MAKE SURE YOU:   Understand these instructions.  Will watch your condition.  Will get help right away if you are not doing well or get worse. Document Released: 04/07/2007 Document Revised: 02/17/2012 Document Reviewed: 06/08/2007 [facility] Patient Information 2014 [place], [place].   ________________________________________________________________

## 2017-05-06 ENCOUNTER — Encounter: Payer: Medicare Other | Admitting: Registered"

## 2017-05-06 ENCOUNTER — Encounter (HOSPITAL_COMMUNITY)
Admission: RE | Admit: 2017-05-06 | Discharge: 2017-05-06 | Disposition: A | Discharge status: Home / Self Care | Payer: Medicare Other | Source: Ambulatory Visit | Attending: Surgery | Admitting: Surgery

## 2017-05-06 ENCOUNTER — Encounter (HOSPITAL_COMMUNITY): Payer: Self-pay

## 2017-05-06 ENCOUNTER — Encounter: Payer: Self-pay | Admitting: Registered"

## 2017-05-06 DIAGNOSIS — Z01812 Encounter for preprocedural laboratory examination: Secondary | ICD-10-CM | POA: Diagnosis present

## 2017-05-06 DIAGNOSIS — E669 Obesity, unspecified: Secondary | ICD-10-CM

## 2017-05-06 HISTORY — DX: Anemia, unspecified: D64.9

## 2017-05-06 HISTORY — DX: Personal history of urinary calculi: Z87.442

## 2017-05-06 HISTORY — DX: Nontoxic multinodular goiter: E04.2

## 2017-05-06 HISTORY — DX: Post-traumatic stress disorder, unspecified: F43.10

## 2017-05-06 HISTORY — DX: Cardiac arrhythmia, unspecified: I49.9

## 2017-05-06 HISTORY — DX: Lyme disease, unspecified: A69.20

## 2017-05-06 HISTORY — DX: Headache: R51

## 2017-05-06 HISTORY — DX: Unspecified osteoarthritis, unspecified site: M19.90

## 2017-05-06 HISTORY — DX: Headache, unspecified: R51.9

## 2017-05-06 HISTORY — DX: Personal history of other diseases of the digestive system: Z87.19

## 2017-05-06 HISTORY — DX: Malignant (primary) neoplasm, unspecified: C80.1

## 2017-05-06 HISTORY — DX: Sleep apnea, unspecified: G47.30

## 2017-05-06 HISTORY — DX: Transient cerebral ischemic attack, unspecified: G45.9

## 2017-05-06 LAB — CBC WITH DIFFERENTIAL/PLATELET
BASOS ABS: 0 10*3/uL (ref 0.0–0.1)
Basophils Relative: 0 %
Eosinophils Absolute: 0 10*3/uL (ref 0.0–0.7)
Eosinophils Relative: 0 %
HEMATOCRIT: 38.6 % (ref 36.0–46.0)
Hemoglobin: 12.8 g/dL (ref 12.0–15.0)
LYMPHS PCT: 18 %
Lymphs Abs: 2.1 10*3/uL (ref 0.7–4.0)
MCH: 29.2 pg (ref 26.0–34.0)
MCHC: 33.2 g/dL (ref 30.0–36.0)
MCV: 88.1 fL (ref 78.0–100.0)
Monocytes Absolute: 0.8 10*3/uL (ref 0.1–1.0)
Monocytes Relative: 7 %
NEUTROS ABS: 8.4 10*3/uL — AB (ref 1.7–7.7)
Neutrophils Relative %: 75 %
PLATELETS: 342 10*3/uL (ref 150–400)
RBC: 4.38 MIL/uL (ref 3.87–5.11)
RDW: 14.7 % (ref 11.5–15.5)
WBC: 11.3 10*3/uL — AB (ref 4.0–10.5)

## 2017-05-06 LAB — COMPREHENSIVE METABOLIC PANEL
ALT: 19 U/L (ref 14–54)
AST: 19 U/L (ref 15–41)
Albumin: 4 g/dL (ref 3.5–5.0)
Alkaline Phosphatase: 53 U/L (ref 38–126)
Anion gap: 9 (ref 5–15)
BUN: 22 mg/dL — ABNORMAL HIGH (ref 6–20)
CHLORIDE: 104 mmol/L (ref 101–111)
CO2: 23 mmol/L (ref 22–32)
Calcium: 9.1 mg/dL (ref 8.9–10.3)
Creatinine, Ser: 1.14 mg/dL — ABNORMAL HIGH (ref 0.44–1.00)
GFR calc Af Amer: >60 mL/min (ref >60)
GFR, EST NON AFRICAN AMERICAN: 55 mL/min — AB (ref >60)
Glucose, Bld: 106 mg/dL — ABNORMAL HIGH (ref 65–99)
Potassium: 4.3 mmol/L (ref 3.5–5.1)
Sodium: 136 mmol/L (ref 135–145)
Total Bilirubin: 0.5 mg/dL (ref 0.3–1.2)
Total Protein: 7.3 g/dL (ref 6.5–8.1)

## 2017-05-06 NOTE — Progress Notes (Addendum)
Appt start time: 11:10 end time: 11:25 Assessment: 5th SWL Appointment   Start Wt at NDES: 261.9 Wt: 247.0 BMI: 42.73   Pt arrives having lost 6.6 lbs from previous visit. Pt states this is her last SWL appt and scheduled for surgery on Mon 6/4. Pt states she thinks she injured her shoulder working out on a [place], water. Pt states her injuries have hindered her from being able to workout. Pt reports currently getting infusions in her neck and back. Pt states she has not been able to use treadmill as well. Pt reports cutting out all sweets except juices. Pt states she has added in non-starchy vegetables to her daily routine. Pt states she has noticed having a reaction of itching to whey protein.    Preferred Learning Style:   Auditory  Visual  No preference indicated   Learning Readiness:   Ready  Change in progress  MEDICATIONS: See medication list   DIETARY INTAKE:  24-hr recall:  B ( AM): scrambled eggs, [place], onion Snk ( AM): cucumbers, carrots L ( PM): protein shake Snk ( PM): none D ( PM): steak, onion, brown rice, refried beans Snk ( PM): none Beverages: water, shake  Usual physical activity: 4 times per week with treadmill  Diet to Follow: 1500 calories 170 g carbohydrates 112 g protein 42 g fat   Nutritional Diagnosis:  [place]-3.3 Overweight/obesity related to past poor dietary habits and physical inactivity as evidenced by patient w/ upcoming sleeve gastrectomy surgery following dietary guidelines for continued weight loss.    Intervention:  Nutrition counseling for upcoming Bariatric Surgery.  Goals:  - Pre-Op diet adding in vegetables and eliminating rice, starches, and carbohydrates.  - Continue to aim for 64 oz of fluid daily.  - Vegan protein shake option: [employer] Shake or other options with at least 15g or more of protein and 5g or less of carbohydrates.   Teaching Method Utilized:  Visual Auditory  Handouts  given during visit include: none  Barriers to learning/adherence to lifestyle change: none identified  Demonstrated degree of understanding via: Teach Back   Monitoring/Evaluation:  Dietary intake, exercise, and body weight prn.

## 2017-05-06 NOTE — Patient Instructions (Addendum)
-   Pre-Op diet adding in vegetables and eliminating rice, starches, and carbohydrates.   - Continue to aim for 64 oz of fluid daily.   - Vegan protein shake option: [employer] Shake or other options with at least 15g or more of protein and 5g or less of carbohydrates.

## 2017-05-07 NOTE — Progress Notes (Signed)
ekg 12-30-16 epic

## 2017-05-08 NOTE — Progress Notes (Signed)
Requested lov note dr Rhodia Albright cardiology stress test and most recent ekg via fax with release of information attached, fax confirmation received and placed on chart.

## 2017-05-12 ENCOUNTER — Encounter (HOSPITAL_COMMUNITY)
Admission: RE | Disposition: A | Discharge status: Home / Self Care | Payer: Self-pay | Source: Ambulatory Visit | Attending: Surgery

## 2017-05-12 ENCOUNTER — Inpatient Hospital Stay (HOSPITAL_COMMUNITY): Payer: Medicare Other | Admitting: Anesthesiology

## 2017-05-12 ENCOUNTER — Encounter (HOSPITAL_COMMUNITY): Payer: Self-pay | Admitting: *Deleted

## 2017-05-12 ENCOUNTER — Inpatient Hospital Stay (HOSPITAL_COMMUNITY)
Admission: RE | Admit: 2017-05-12 | Discharge: 2017-05-14 | DRG: 621 | Disposition: A | Discharge status: Home / Self Care | Payer: Medicare Other | Source: Ambulatory Visit | Attending: Surgery | Admitting: Surgery

## 2017-05-12 DIAGNOSIS — M170 Bilateral primary osteoarthritis of knee: Secondary | ICD-10-CM | POA: Diagnosis present

## 2017-05-12 DIAGNOSIS — Z8673 Personal history of transient ischemic attack (TIA), and cerebral infarction without residual deficits: Secondary | ICD-10-CM

## 2017-05-12 DIAGNOSIS — K219 Gastro-esophageal reflux disease without esophagitis: Secondary | ICD-10-CM | POA: Diagnosis present

## 2017-05-12 DIAGNOSIS — G4730 Sleep apnea, unspecified: Secondary | ICD-10-CM | POA: Diagnosis present

## 2017-05-12 DIAGNOSIS — Z85528 Personal history of other malignant neoplasm of kidney: Secondary | ICD-10-CM

## 2017-05-12 DIAGNOSIS — Z6841 Body Mass Index (BMI) 40.0 and over, adult: Secondary | ICD-10-CM | POA: Diagnosis not present

## 2017-05-12 DIAGNOSIS — Z79899 Other long term (current) drug therapy: Secondary | ICD-10-CM

## 2017-05-12 DIAGNOSIS — M47812 Spondylosis without myelopathy or radiculopathy, cervical region: Secondary | ICD-10-CM | POA: Diagnosis present

## 2017-05-12 DIAGNOSIS — K449 Diaphragmatic hernia without obstruction or gangrene: Secondary | ICD-10-CM | POA: Diagnosis present

## 2017-05-12 DIAGNOSIS — Z9884 Bariatric surgery status: Secondary | ICD-10-CM

## 2017-05-12 DIAGNOSIS — F419 Anxiety disorder, unspecified: Secondary | ICD-10-CM | POA: Diagnosis present

## 2017-05-12 HISTORY — PX: LAPAROSCOPIC GASTRIC SLEEVE RESECTION: SHX5895

## 2017-05-12 LAB — CBC
HCT: 36.7 % (ref 36.0–46.0)
HEMOGLOBIN: 12.1 g/dL (ref 12.0–15.0)
MCH: 29.2 pg (ref 26.0–34.0)
MCHC: 33 g/dL (ref 30.0–36.0)
MCV: 88.6 fL (ref 78.0–100.0)
PLATELETS: 311 10*3/uL (ref 150–400)
RBC: 4.14 MIL/uL (ref 3.87–5.11)
RDW: 14.5 % (ref 11.5–15.5)
WBC: 13.3 10*3/uL — ABNORMAL HIGH (ref 4.0–10.5)

## 2017-05-12 LAB — CREATININE, SERUM
CREATININE: 0.96 mg/dL (ref 0.44–1.00)
GFR calc Af Amer: >60 mL/min (ref >60)

## 2017-05-12 SURGERY — GASTRECTOMY, SLEEVE, LAPAROSCOPIC
Anesthesia: General | Site: Abdomen

## 2017-05-12 MED ORDER — ONDANSETRON HCL 4 MG/2ML IJ SOLN
INTRAMUSCULAR | Status: DC | PRN
  Administered 2017-05-12: 4 mg via INTRAVENOUS

## 2017-05-12 MED ORDER — HEPARIN SODIUM (PORCINE) 5000 UNIT/ML IJ SOLN
5000.0000 [IU] | Freq: Three times a day (TID) | INTRAMUSCULAR | Status: DC
  Administered 2017-05-12 – 2017-05-14 (×5): 5000 [IU] via SUBCUTANEOUS
  Filled 2017-05-12 (×3): qty 1

## 2017-05-12 MED ORDER — ESCITALOPRAM OXALATE 20 MG PO TABS
20.0000 mg | ORAL_TABLET | Freq: Every day | ORAL | Status: DC
Start: 2017-05-13 — End: 2017-05-14
  Administered 2017-05-13 – 2017-05-14 (×2): 20 mg via ORAL
  Filled 2017-05-12 (×2): qty 1

## 2017-05-12 MED ORDER — FENTANYL CITRATE (PF) 100 MCG/2ML IJ SOLN
INTRAMUSCULAR | Status: DC | PRN
  Administered 2017-05-12: 100 ug via INTRAVENOUS

## 2017-05-12 MED ORDER — SCOPOLAMINE 1 MG/3DAYS TD PT72
1.0000 | MEDICATED_PATCH | TRANSDERMAL | Status: DC
  Administered 2017-05-12: 1.5 mg via TRANSDERMAL
  Filled 2017-05-12: qty 1

## 2017-05-12 MED ORDER — SUGAMMADEX SODIUM 200 MG/2ML IV SOLN
INTRAVENOUS | Status: AC
  Filled 2017-05-12: qty 2

## 2017-05-12 MED ORDER — APREPITANT 40 MG PO CAPS
40.0000 mg | ORAL_CAPSULE | ORAL | Status: AC
  Administered 2017-05-12: 40 mg via ORAL
  Filled 2017-05-12: qty 1

## 2017-05-12 MED ORDER — VASOPRESSIN 20 UNIT/ML IV SOLN
INTRAVENOUS | Status: DC | PRN
  Administered 2017-05-12 (×2): .5 [IU] via INTRAVENOUS

## 2017-05-12 MED ORDER — HYDROMORPHONE HCL 1 MG/ML IJ SOLN
0.2500 mg | INTRAMUSCULAR | Status: DC | PRN
  Administered 2017-05-12 (×4): 0.5 mg via INTRAVENOUS
  Filled 2017-05-12: qty 0.5

## 2017-05-12 MED ORDER — SUCCINYLCHOLINE CHLORIDE 200 MG/10ML IV SOSY
PREFILLED_SYRINGE | INTRAVENOUS | Status: DC | PRN
  Administered 2017-05-12: 100 mg via INTRAVENOUS

## 2017-05-12 MED ORDER — KCL IN DEXTROSE-NACL 20-5-0.45 MEQ/L-%-% IV SOLN
INTRAVENOUS | Status: DC
  Administered 2017-05-12 – 2017-05-13 (×3): via INTRAVENOUS
  Administered 2017-05-13: 1000 mL via INTRAVENOUS
  Filled 2017-05-12 (×7): qty 1000

## 2017-05-12 MED ORDER — MIDAZOLAM HCL 2 MG/2ML IJ SOLN
INTRAMUSCULAR | Status: DC | PRN
  Administered 2017-05-12: 2 mg via INTRAVENOUS

## 2017-05-12 MED ORDER — MIDODRINE HCL 5 MG PO TABS
5.0000 mg | ORAL_TABLET | Freq: Three times a day (TID) | ORAL | Status: DC | PRN
  Filled 2017-05-12: qty 1

## 2017-05-12 MED ORDER — FENTANYL CITRATE (PF) 100 MCG/2ML IJ SOLN
INTRAMUSCULAR | Status: AC
  Filled 2017-05-12: qty 2

## 2017-05-12 MED ORDER — LIDOCAINE 2% (20 MG/ML) 5 ML SYRINGE
INTRAMUSCULAR | Status: AC
  Filled 2017-05-12: qty 5

## 2017-05-12 MED ORDER — ONDANSETRON HCL 4 MG PO TABS: 4.0000 mg | ORAL_TABLET | Freq: Three times a day (TID) | ORAL | Status: DC | PRN

## 2017-05-12 MED ORDER — HYDROMORPHONE HCL 1 MG/ML IJ SOLN
INTRAMUSCULAR | Status: AC
  Filled 2017-05-12: qty 2

## 2017-05-12 MED ORDER — ACETAMINOPHEN 500 MG PO TABS
1000.0000 mg | ORAL_TABLET | ORAL | Status: AC
  Administered 2017-05-12: 1000 mg via ORAL
  Filled 2017-05-12: qty 2

## 2017-05-12 MED ORDER — PROPOFOL 10 MG/ML IV BOLUS
INTRAVENOUS | Status: DC | PRN
  Administered 2017-05-12: 150 mg via INTRAVENOUS

## 2017-05-12 MED ORDER — ONDANSETRON HCL 4 MG/2ML IJ SOLN: 4.0000 mg | INTRAMUSCULAR | Status: DC | PRN

## 2017-05-12 MED ORDER — CEFOTETAN DISODIUM-DEXTROSE 2-2.08 GM-% IV SOLR: 2.0000 g | INTRAVENOUS | Status: DC

## 2017-05-12 MED ORDER — VASOPRESSIN 20 UNIT/ML IV SOLN
INTRAVENOUS | Status: AC
  Filled 2017-05-12: qty 1

## 2017-05-12 MED ORDER — PANTOPRAZOLE SODIUM 40 MG IV SOLR
40.0000 mg | Freq: Every day | INTRAVENOUS | Status: DC
  Administered 2017-05-12 – 2017-05-13 (×2): 40 mg via INTRAVENOUS
  Filled 2017-05-12 (×2): qty 40

## 2017-05-12 MED ORDER — LACTATED RINGERS IR SOLN
Status: DC | PRN
  Administered 2017-05-12: 1000 mL

## 2017-05-12 MED ORDER — OXYCODONE HCL 5 MG/5ML PO SOLN
5.0000 mg | ORAL | Status: DC | PRN
  Administered 2017-05-12 (×2): 5 mg via ORAL
  Administered 2017-05-13 – 2017-05-14 (×6): 10 mg via ORAL
  Filled 2017-05-12 (×2): qty 10
  Filled 2017-05-12: qty 5
  Filled 2017-05-12: qty 10
  Filled 2017-05-12 (×3): qty 5
  Filled 2017-05-12 (×2): qty 10

## 2017-05-12 MED ORDER — LACTATED RINGERS IV SOLN
INTRAVENOUS | Status: DC
  Administered 2017-05-12 (×2): via INTRAVENOUS

## 2017-05-12 MED ORDER — PHENYLEPHRINE HCL 10 MG/ML IJ SOLN
INTRAMUSCULAR | Status: AC
  Filled 2017-05-12: qty 2

## 2017-05-12 MED ORDER — PHENYLEPHRINE HCL 10 MG/ML IJ SOLN
INTRAMUSCULAR | Status: DC | PRN
  Administered 2017-05-12 (×2): 120 ug via INTRAVENOUS
  Administered 2017-05-12 (×2): 40 ug via INTRAVENOUS
  Administered 2017-05-12: 80 ug via INTRAVENOUS

## 2017-05-12 MED ORDER — CHLORHEXIDINE GLUCONATE CLOTH 2 % EX PADS: 6.0000 | MEDICATED_PAD | Freq: Once | CUTANEOUS | Status: DC

## 2017-05-12 MED ORDER — ACETAMINOPHEN 160 MG/5ML PO SOLN
325.0000 mg | ORAL | Status: DC | PRN
  Administered 2017-05-14: 650 mg via ORAL
  Filled 2017-05-12: qty 20.3

## 2017-05-12 MED ORDER — NORTRIPTYLINE HCL 25 MG PO CAPS
50.0000 mg | ORAL_CAPSULE | Freq: Every day | ORAL | Status: DC
  Administered 2017-05-12 – 2017-05-13 (×2): 50 mg via ORAL
  Filled 2017-05-12 (×2): qty 2

## 2017-05-12 MED ORDER — ROCURONIUM BROMIDE 50 MG/5ML IV SOSY
PREFILLED_SYRINGE | INTRAVENOUS | Status: AC
  Filled 2017-05-12: qty 5

## 2017-05-12 MED ORDER — ACETAMINOPHEN 325 MG PO TABS
650.0000 mg | ORAL_TABLET | ORAL | Status: DC | PRN
  Administered 2017-05-13: 650 mg via ORAL
  Filled 2017-05-12: qty 2

## 2017-05-12 MED ORDER — 0.9 % SODIUM CHLORIDE (POUR BTL) OPTIME
TOPICAL | Status: DC | PRN
  Administered 2017-05-12: 1000 mL

## 2017-05-12 MED ORDER — PREMIER PROTEIN SHAKE
2.0000 [oz_av] | ORAL | Status: DC
  Administered 2017-05-13 – 2017-05-14 (×10): 2 [oz_av] via ORAL
  Filled 2017-05-12 (×16): qty 325.31

## 2017-05-12 MED ORDER — MIDAZOLAM HCL 2 MG/2ML IJ SOLN
INTRAMUSCULAR | Status: AC
  Filled 2017-05-12: qty 2

## 2017-05-12 MED ORDER — CIPROFLOXACIN IN D5W 400 MG/200ML IV SOLN
400.0000 mg | Freq: Once | INTRAVENOUS | Status: AC
  Administered 2017-05-12: 400 mg via INTRAVENOUS

## 2017-05-12 MED ORDER — CIPROFLOXACIN IN D5W 400 MG/200ML IV SOLN
INTRAVENOUS | Status: AC
  Filled 2017-05-12: qty 200

## 2017-05-12 MED ORDER — MEPERIDINE HCL 50 MG/ML IJ SOLN: 6.2500 mg | INTRAMUSCULAR | Status: DC | PRN

## 2017-05-12 MED ORDER — PROMETHAZINE HCL 25 MG/ML IJ SOLN
INTRAMUSCULAR | Status: AC
  Filled 2017-05-12: qty 1

## 2017-05-12 MED ORDER — PROPOFOL 10 MG/ML IV BOLUS
INTRAVENOUS | Status: AC
  Filled 2017-05-12: qty 20

## 2017-05-12 MED ORDER — BUPIVACAINE LIPOSOME 1.3 % IJ SUSP
20.0000 mL | Freq: Once | INTRAMUSCULAR | Status: AC
  Administered 2017-05-12: 20 mL
  Filled 2017-05-12: qty 20

## 2017-05-12 MED ORDER — DEXTROSE 5 % IV SOLN
INTRAVENOUS | Status: DC | PRN
  Administered 2017-05-12: 20 ug/min via INTRAVENOUS

## 2017-05-12 MED ORDER — CARVEDILOL 25 MG PO TABS
50.0000 mg | ORAL_TABLET | Freq: Two times a day (BID) | ORAL | Status: DC
  Administered 2017-05-12 – 2017-05-14 (×4): 50 mg via ORAL
  Filled 2017-05-12: qty 2
  Filled 2017-05-12 (×2): qty 4
  Filled 2017-05-12 (×2): qty 2

## 2017-05-12 MED ORDER — PHENYLEPHRINE 40 MCG/ML (10ML) SYRINGE FOR IV PUSH (FOR BLOOD PRESSURE SUPPORT)
PREFILLED_SYRINGE | INTRAVENOUS | Status: AC
  Filled 2017-05-12: qty 10

## 2017-05-12 MED ORDER — ROCURONIUM BROMIDE 50 MG/5ML IV SOSY
PREFILLED_SYRINGE | INTRAVENOUS | Status: DC | PRN
  Administered 2017-05-12 (×2): 10 mg via INTRAVENOUS
  Administered 2017-05-12: 40 mg via INTRAVENOUS

## 2017-05-12 MED ORDER — GABAPENTIN 300 MG PO CAPS
300.0000 mg | ORAL_CAPSULE | ORAL | Status: AC
  Administered 2017-05-12: 300 mg via ORAL
  Filled 2017-05-12: qty 1

## 2017-05-12 MED ORDER — SUGAMMADEX SODIUM 200 MG/2ML IV SOLN
INTRAVENOUS | Status: AC
Start: 2017-05-12 — End: 2017-05-12
  Filled 2017-05-12: qty 2

## 2017-05-12 MED ORDER — ONDANSETRON HCL 4 MG/2ML IJ SOLN
INTRAMUSCULAR | Status: AC
  Filled 2017-05-12: qty 2

## 2017-05-12 MED ORDER — DEXAMETHASONE SODIUM PHOSPHATE 10 MG/ML IJ SOLN
INTRAMUSCULAR | Status: AC
  Filled 2017-05-12: qty 1

## 2017-05-12 MED ORDER — HEPARIN SODIUM (PORCINE) 5000 UNIT/ML IJ SOLN
5000.0000 [IU] | INTRAMUSCULAR | Status: AC
  Administered 2017-05-12: 5000 [IU] via SUBCUTANEOUS
  Filled 2017-05-12: qty 1

## 2017-05-12 MED ORDER — SUGAMMADEX SODIUM 200 MG/2ML IV SOLN
INTRAVENOUS | Status: DC | PRN
  Administered 2017-05-12: 100 mg via INTRAVENOUS
  Administered 2017-05-12: 200 mg via INTRAVENOUS

## 2017-05-12 MED ORDER — MORPHINE SULFATE (PF) 2 MG/ML IV SOLN
1.0000 mg | INTRAVENOUS | Status: DC | PRN
  Administered 2017-05-12 – 2017-05-13 (×2): 2 mg via INTRAVENOUS
  Filled 2017-05-12 (×2): qty 1

## 2017-05-12 MED ORDER — LIDOCAINE 2% (20 MG/ML) 5 ML SYRINGE
INTRAMUSCULAR | Status: DC | PRN
  Administered 2017-05-12: 80 mg via INTRAVENOUS

## 2017-05-12 MED ORDER — CELECOXIB 200 MG PO CAPS
400.0000 mg | ORAL_CAPSULE | ORAL | Status: AC
Start: 2017-05-12 — End: 2017-05-12
  Administered 2017-05-12: 400 mg via ORAL
  Filled 2017-05-12: qty 2

## 2017-05-12 MED ORDER — SUCCINYLCHOLINE CHLORIDE 200 MG/10ML IV SOSY
PREFILLED_SYRINGE | INTRAVENOUS | Status: AC
  Filled 2017-05-12: qty 10

## 2017-05-12 MED ORDER — DEXAMETHASONE SODIUM PHOSPHATE 4 MG/ML IJ SOLN
4.0000 mg | INTRAMUSCULAR | Status: DC
  Filled 2017-05-12: qty 1

## 2017-05-12 MED ORDER — DEXAMETHASONE SODIUM PHOSPHATE 10 MG/ML IJ SOLN
INTRAMUSCULAR | Status: DC | PRN
  Administered 2017-05-12: 10 mg via INTRAVENOUS

## 2017-05-12 MED ORDER — PROMETHAZINE HCL 25 MG/ML IJ SOLN
6.2500 mg | INTRAMUSCULAR | Status: AC | PRN
  Administered 2017-05-12 (×2): 6.25 mg via INTRAVENOUS

## 2017-05-12 MED ORDER — EPHEDRINE 5 MG/ML INJ
INTRAVENOUS | Status: AC
  Filled 2017-05-12: qty 10

## 2017-05-12 SURGICAL SUPPLY — 61 items
APPLICATOR COTTON TIP 6IN STRL (MISCELLANEOUS) IMPLANT
APPLIER CLIP 5 13 M/L LIGAMAX5 (MISCELLANEOUS)
APPLIER CLIP ROT 10 11.4 M/L (STAPLE)
APPLIER CLIP ROT 13.4 12 LRG (CLIP)
BAG LAPAROSCOPIC 12 15 PORT 16 (BASKET) ×1 IMPLANT
BAG RETRIEVAL 12/15 (BASKET) ×2
BAG RETRIEVAL 12/15MM (BASKET) ×1
BLADE SURG 15 STRL LF DISP TIS (BLADE) ×1 IMPLANT
BLADE SURG 15 STRL SS (BLADE) ×2
CABLE HIGH FREQUENCY MONO STRZ (ELECTRODE) ×3 IMPLANT
CLIP APPLIE 5 13 M/L LIGAMAX5 (MISCELLANEOUS) IMPLANT
CLIP APPLIE ROT 10 11.4 M/L (STAPLE) IMPLANT
CLIP APPLIE ROT 13.4 12 LRG (CLIP) IMPLANT
DERMABOND ADVANCED (GAUZE/BANDAGES/DRESSINGS) ×2
DERMABOND ADVANCED .7 DNX12 (GAUZE/BANDAGES/DRESSINGS) ×1 IMPLANT
DEVICE SUT QUICK LOAD TK 5 (STAPLE) ×4 IMPLANT
DEVICE SUT TI-KNOT TK 5X26 (MISCELLANEOUS) ×2 IMPLANT
DEVICE SUTURE ENDOST 10MM (ENDOMECHANICALS) ×3 IMPLANT
DEVICE TI KNOT TK5 (MISCELLANEOUS) ×1
DEVICE TROCAR PUNCTURE CLOSURE (ENDOMECHANICALS) ×3 IMPLANT
DISSECTOR BLUNT TIP ENDO 5MM (MISCELLANEOUS) IMPLANT
GAUZE SPONGE 4X4 12PLY STRL (GAUZE/BANDAGES/DRESSINGS) IMPLANT
GLOVE BIOGEL M 8.0 STRL (GLOVE) ×3 IMPLANT
GOWN STRL REUS W/TWL XL LVL3 (GOWN DISPOSABLE) ×12 IMPLANT
HANDLE STAPLE EGIA 4 XL (STAPLE) ×3 IMPLANT
HOVERMATT SINGLE USE (MISCELLANEOUS) ×3 IMPLANT
KIT BASIN OR (CUSTOM PROCEDURE TRAY) ×3 IMPLANT
MARKER SKIN DUAL TIP RULER LAB (MISCELLANEOUS) ×3 IMPLANT
NEEDLE SPNL 22GX3.5 QUINCKE BK (NEEDLE) ×3 IMPLANT
PACK UNIVERSAL I (CUSTOM PROCEDURE TRAY) ×3 IMPLANT
QUICK LOAD TK 5 (STAPLE) ×2
RELOAD TRI 45 ART MED THCK BLK (STAPLE) ×3 IMPLANT
RELOAD TRI 45 ART MED THCK PUR (STAPLE) IMPLANT
RELOAD TRI 60 ART MED THCK BLK (STAPLE) ×3 IMPLANT
RELOAD TRI 60 ART MED THCK PUR (STAPLE) ×9 IMPLANT
SCISSORS LAP 5X45 EPIX DISP (ENDOMECHANICALS) IMPLANT
SET IRRIG TUBING LAPAROSCOPIC (IRRIGATION / IRRIGATOR) ×3 IMPLANT
SHEARS HARMONIC ACE PLUS 45CM (MISCELLANEOUS) ×3 IMPLANT
SLEEVE ADV FIXATION 5X100MM (TROCAR) ×6 IMPLANT
SLEEVE GASTRECTOMY 36FR VISIGI (MISCELLANEOUS) ×3 IMPLANT
SOLUTION ANTI FOG 6CC (MISCELLANEOUS) ×3 IMPLANT
SPONGE LAP 18X18 X RAY DECT (DISPOSABLE) ×3 IMPLANT
STAPLER VISISTAT 35W (STAPLE) ×3 IMPLANT
SUT MNCRL AB 4-0 PS2 18 (SUTURE) ×9 IMPLANT
SUT SURGIDAC NAB ES-9 0 48 120 (SUTURE) ×6 IMPLANT
SUT VIC AB 4-0 SH 18 (SUTURE) ×3 IMPLANT
SUT VICRYL 0 TIES 12 18 (SUTURE) ×3 IMPLANT
SYR 10ML ECCENTRIC (SYRINGE) ×3 IMPLANT
SYR 20CC LL (SYRINGE) ×3 IMPLANT
SYR 50ML LL SCALE MARK (SYRINGE) ×3 IMPLANT
TOWEL OR 17X26 10 PK STRL BLUE (TOWEL DISPOSABLE) ×6 IMPLANT
TOWEL OR NON WOVEN STRL DISP B (DISPOSABLE) ×3 IMPLANT
TRAY FOLEY W/METER SILVER 16FR (SET/KITS/TRAYS/PACK) IMPLANT
TROCAR ADV FIXATION 5X100MM (TROCAR) ×3 IMPLANT
TROCAR BLADELESS 15MM (ENDOMECHANICALS) ×3 IMPLANT
TROCAR BLADELESS OPT 5 100 (ENDOMECHANICALS) ×3 IMPLANT
TUBE CALIBRATION LAPBAND (TUBING) ×3 IMPLANT
TUBING CONNECTING 10 (TUBING) ×4 IMPLANT
TUBING CONNECTING 10' (TUBING) ×2
TUBING ENDO SMARTCAP (MISCELLANEOUS) ×3 IMPLANT
TUBING INSUF HEATED (TUBING) ×3 IMPLANT

## 2017-05-12 NOTE — Op Note (Signed)
Name:  Averly Ericson MRN: [id number] Date of Surgery: 05/12/2017  Preop Diagnosis:  Morbid Obesity  Postop Diagnosis:  Morbid Obesity (Weight - 266, BMI - 44), S/P Gastric Sleeve resection  Procedure:  Upper endoscopy  (Intraoperative)  Surgeon:  Alphonsa Overall, M.D.  Anesthesia:  GET  Indications for procedure: Pamela Pope is a 51 y.o. female whose primary care physician is [facility] and has completed a gastric sleeve resection today for weight loss by Dr. Hassell Done.  I am doing an intraoperative upper endoscopy to evaluate the gastric pouch after the sleeve gastrectomy.  Operative Note: The patient is under general anesthesia.  Dr. Hassell Done is laparoscoping the patient while I do an upper endoscopy to evaluate the stomach pouch.  With the patient intubated, I passed the Pentax upper endoscope without difficulty down the esophagus.  The esophagus was unremarkable.  The esophago-gastric junction was at 39 cm.    The mucosa of the stomach looked viable and the staple line was intact without bleeding.  I advanced the scope to the pylorus, but did not go through it.  While I insufflated the stomach pouch with air, Dr. Hassell Done  flooded the upper abdomen with saline to put the gastric pouch under saline.  There was no bubbling or evidence of a leak.  There was no evidence of narrowing of the pouch and the gastric sleeve looked tubular.  The scope was then withdrawn.  The esophagus was unremarkable and the patient tolerated the endoscopy without difficulty.  Alphonsa Overall, MD, [facility] Surgery Pager: [phone] Office phone:  [phone]

## 2017-05-12 NOTE — H&P (View-Only) (Signed)
Nash Dimmer Location: [facility] Surgery Patient #: [id number] DOB: 1966-01-17 Married / Language: English / Race: White Female   History of Present Illness  The patient is a 51 year old female who presents for a bariatric surgery evaluation. Symptoms include excess weight, inability to lose weight and knee pain. Her primary care physician is Dr. Harlow Mares at [facility]  She lives in [place].  BMI over the last 5 years ranges from 39 to ~44. She has tried Fen/Phen, Weight watchers, slimfast, and low calorie diets and Adipex. 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 Her grandmother had gastric bypass in the 12s and did well.   She has provided a good note indicating her reasons for having bariatric surgery. I have discussed sleeve gastrectomy and gastric bypass with her in detail. She and her husband decided to pursue sleeve gastrectomy. She is aware of the risks and complications. She has had left nephrectomy for renal cell carcinoma. She has a sliding hiatal hernia and GERD that only responds to Aciphex.  I told her that we would try to repair her hiatal hernia (seen on outside films).  She is aware of her risks since she has had left nephrectomy  Will move forward with workup.    Past Surgical History  Breast Biopsy  Right. multiple Breast Mass; Local Excision  Right. Cesarean Section - Multiple  Colon Polyp Removal - Colonoscopy  Foot Surgery  Right. Gallbladder Surgery - Laparoscopic  Hysterectomy (not due to cancer) - Complete  Nephrectomy  Left. Oral Surgery  Shoulder Surgery  Right. Spinal Surgery - Lower Back  Spinal Surgery - Neck  Tonsillectomy   Diagnostic Studies History  Colonoscopy  1-5 years ago Mammogram  within last year Pap Smear  1-5 years ago  Allergies  Sulfa 10 *OPHTHALMIC AGENTS*  shortness of breath, itching Septa *DERMATOLOGICALS*  shortness  of breath, itching Docu *LAXATIVES*  Itching Allergies Reconciled   Medication History ( Carvedilol (25MG  Tablet, Oral twice a day) Active. Aciphex (20MG  Tablet DR, Oral daily) Active. Premarin (0.9MG  Tablet, Oral daily) Active. Midodrine HCl (5MG  Tablet, Oral threeq) Active. (Three times a day as needed.) Escitalopram Oxalate (20MG  Tablet, Oral daily) Active. Rexulti (2MG  Tablet, Oral daily) Active. Gabapentin (300MG  Capsule, Oral daily) Active. TraZODone HCl (100MG  Tablet, Oral daily) Active. Flexeril (10MG  Tablet, Oral as needed) Active. (three times a day as needed.) Imitrex STATdose Refill (6MG /0.5ML Soln Cartridge, Subcutaneous as needed) Active. OxyCODONE HCl (10MG  Tablet, Oral as needed) Active. Complete Multi-Vitamin (Oral daily) Active. True aloa - Daily (OTC daily) Active. Biotin 5000 (5MG  Capsule, Oral daily) Active. Probiotic + Omega-3 (Oral daily) Active. Zofran (4MG  Tablet, Oral as needed) Active. Medications Reconciled  Social History  Caffeine use  Carbonated beverages. Illicit drug use  Prefer to discuss with provider. No alcohol use  Tobacco use  Never smoker.  Family History  Alcohol Abuse  Mother, Sister. Arthritis  Mother. Breast Cancer  Family Members In General, Mother. Kidney Disease  Mother.  Pregnancy / Birth History  Age at menarche  68 years. Gravida  3 Maternal age  51-20 Para  3  Other Problems  Anxiety Disorder  Arthritis  Back Pain  Bladder Problems  Cancer  Cerebrovascular Accident  Cholelithiasis  Diverticulosis  Gastroesophageal Reflux Disease  Hemorrhoids  High blood pressure  Kidney Stone  Migraine Headache  Oophorectomy  Bilateral. Sleep Apnea  Transfusion history  Ventral Hernia Repair     Review of Systems General  Present- Fatigue and Weight Gain. Not Present- Appetite Loss, Chills, Fever, Night Sweats and Weight Loss. HEENT Present- Hearing Loss, Ringing in the  Ears, Seasonal Allergies and Wears glasses/contact lenses. Not Present- Earache, Hoarseness, Nose Bleed, Oral Ulcers, Sinus Pain, Sore Throat, Visual Disturbances and Yellow Eyes. Respiratory Present- Snoring and Wheezing. Not Present- Bloody sputum, Chronic Cough and Difficulty Breathing. Breast Not Present- Breast Mass, Breast Pain, Nipple Discharge and Skin Changes. Cardiovascular Present- Rapid Heart Rate. Not Present- Chest Pain, Difficulty Breathing Lying Down, Leg Cramps, Palpitations, Shortness of Breath and Swelling of Extremities. Gastrointestinal Present- Bloating, Excessive gas, Hemorrhoids, Indigestion and Nausea. Not Present- Abdominal Pain, Bloody Stool, Change in Bowel Habits, Chronic diarrhea, Constipation, Difficulty Swallowing, Gets full quickly at meals, Rectal Pain and Vomiting. Female Genitourinary Present- Frequency and Urgency. Not Present- Nocturia, Painful Urination and Pelvic Pain. Musculoskeletal Present- Back Pain, Joint Pain, Joint Stiffness, Muscle Pain and Muscle Weakness. Not Present- Swelling of Extremities. Neurological Present- Headaches and Tingling. Not Present- Decreased Memory, Fainting, Numbness, Seizures, Tremor, Trouble walking and Weakness. Psychiatric Present- Anxiety and Change in Sleep Pattern. Not Present- Bipolar, Depression, Fearful and Frequent crying. Endocrine Present- Cold Intolerance, Hair Changes, Heat Intolerance and Hot flashes. Not Present- Excessive Hunger and New Diabetes. Hematology Present- Easy Bruising. Not Present- Blood Thinners, Excessive bleeding, Gland problems, HIV and Persistent Infections.  Vitals  12/19/2016 3:18 PM Weight: 266.8 lb Height: 65in Body Surface Area: 2.24 m Body Mass Index: 44.4 kg/m  Temp.: 91F  Pulse: 106 (Regular)  BP: 132/88 (Sitting, Left Arm, Standard)  Physical Exam  General Obese WF NAD Note: HEENT unremarkable Neck supple without bruits Chest clear Heart SR without murmurs Abdomen  left upper quadrant incision-kidney removed from renal cell cancer GU Ext arthritis; no DVT Neuro alert and oriented x 3 with normal motor and sensory function     Assessment & Plan  MORBID OBESITY, UNSPECIFIED OBESITY TYPE (E66.01)  Plan sleeve gastrectomy with hiatal hernia repair.   Matt B. Hassell Done, MD, FACS

## 2017-05-12 NOTE — Interval H&P Note (Signed)
History and Physical Interval Note:  05/12/2017 10:59 AM  Pamela Pope  has presented today for surgery, with the diagnosis of Morbid Obesity, GERD, joint pain, H/O Lyme Disease  The various methods of treatment have been discussed with the patient and family. After consideration of risks, benefits and other options for treatment, the patient has consented to  Procedure(s): LAPAROSCOPIC GASTRIC SLEEVE RESECTION, POSSIBLE UPPER ENDO (N/A) as a surgical intervention .  The patient's history has been reviewed, patient examined, no change in status, stable for surgery.  I have reviewed the patient's chart and labs.  Questions were answered to the patient's satisfaction.     Kehaulani Fruin B

## 2017-05-12 NOTE — Discharge Instructions (Signed)

## 2017-05-12 NOTE — Transfer of Care (Signed)
Immediate Anesthesia Transfer of Care Note  Patient: Pamela Pope  Procedure(s) Performed: Procedure(s): LAPAROSCOPIC GASTRIC SLEEVE RESECTION WITH HIATAL HERNIA REPAIR AND UPPER ENDOSCOPY (N/A)  Patient Location: PACU  Anesthesia Type:General  Level of Consciousness: awake, alert , oriented and patient cooperative  Airway & Oxygen Therapy: Patient Spontanous Breathing and Patient connected to face mask oxygen  Post-op Assessment: Report given to RN and Post -op Vital signs reviewed and stable  Post vital signs: Reviewed and stable  Last Vitals:  Vitals:   05/12/17 0850  BP: 132/72  Pulse: 91  Resp: 16  Temp: 37.1 C    Last Pain: There were no vitals filed for this visit.       Complications: No apparent anesthesia complications

## 2017-05-12 NOTE — Op Note (Signed)
Surgeon: Kaylyn Lim, MD, FACS  Asst:  Alphonsa Overall, MD,FACS  Anes:  General endotracheal  Procedure: Two suture posterior repair of hiatua hernia, Laparoscopic sleeve gastrectomy and upper endoscopy  Diagnosis: Morbid obesity  Complications: none  EBL:   8 cc  Description of Procedure:  The patient was take to OR 1 and given general anesthesia.  The abdomen was prepped with Technicare and draped sterilely.  A timeout was performed.  Access to the abdomen was achieved with a 5 mm Optiview through the right upper quadrant without difficulty.  Following insufflation, the state of the abdomen was found to be with adhesions in the left upper quadrant and after the camera port was place, these were lysed with the scissors and Harmonic scalpel.   Because of her long standing GER and despite the normal single column UGI, we performed the balloon test which showed a prominent sliding hernia posteriorly.  Posterior dissection was done and there was a broad based defect that was closed with two Endostich 0 surgidec.  The balloon test was repeated and was negative for sliding.   The ViSiGi 36Fr tube was inserted to deflate the stomach and was pulled back into the esophagus.    The pylorus was identified and we measured 6 cm back and marked the antrum.  At that point we began dissection to take down the greater curvature of the stomach using the Harmonic scalpel.  This dissection was taken all the way up to the left crus.  Posterior attachments of the stomach were also taken down.    The ViSiGi tube was then passed into the antrum and suction applied so that it was snug along the lessor curvature.  The "crow's foot" or incisura was identified.  The sleeve gastrectomy was begun using the [employer] stapler beginning with a 4.5 Black load with TRS.  A 6 cm black with TRS followed and then the rest were 6 cm purple loads.  .  When the sleeve was complete the tube was taken off suction and  insufflated briefly.  The tube was withdrawn.  Upper endoscopy was then performed by Dr. Lucia Gaskins and this showed no bubbles or bleeding and there was good symmetry to the sleeve.     The specimen was extracted through the 15 trocar site using the large sac.  Endoclose was used to close the 15 port.  Wounds were infiltrated with Exparel  and closed with 4-0 Monocryl and Dermabond.    Sponge and needle counts were reported as correct and the patient was taken to the PACU in stable condition.    Matt B. Hassell Done, [place], [facility], [place]

## 2017-05-12 NOTE — Progress Notes (Signed)
Dr. Lissa Hoard notified of patient's history of sleep apnea-orders written by Dr. Hassell Done regarding nasal oxygen and continuous pulse oximeter

## 2017-05-12 NOTE — Anesthesia Procedure Notes (Signed)
Procedure Name: Intubation Date/Time: 05/12/2017 11:22 AM Performed by: Dione Booze Pre-anesthesia Checklist: Emergency Drugs available, Suction available, Patient being monitored and Patient identified Patient Re-evaluated:Patient Re-evaluated prior to inductionOxygen Delivery Method: Circle system utilized Preoxygenation: Pre-oxygenation with 100% oxygen Intubation Type: IV induction Laryngoscope Size: Mac and 4 Grade View: Grade II Tube type: Oral Tube size: 7.5 mm Number of attempts: 1 Airway Equipment and Method: Stylet Placement Confirmation: ETT inserted through vocal cords under direct vision,  positive ETCO2 and breath sounds checked- equal and bilateral Secured at: 22 cm Tube secured with: Tape Dental Injury: Teeth and Oropharynx as per pre-operative assessment

## 2017-05-12 NOTE — Anesthesia Postprocedure Evaluation (Signed)
Anesthesia Post Note  Patient: Pamela Pope  Procedure(s) Performed: Procedure(s) (LRB): LAPAROSCOPIC GASTRIC SLEEVE RESECTION WITH HIATAL HERNIA REPAIR AND UPPER ENDOSCOPY (N/A)     Patient location during evaluation: PACU Anesthesia Type: General Level of consciousness: sedated and patient cooperative Pain management: pain level controlled Vital Signs Assessment: post-procedure vital signs reviewed and stable Respiratory status: spontaneous breathing Cardiovascular status: stable Anesthetic complications: no    Last Vitals:  Vitals:   05/12/17 1425 05/12/17 1430  BP:  140/87  Pulse: 92 80  Resp: (!) 22 13  Temp:  36.9 C    Last Pain:  Vitals:   05/12/17 1430  PainSc: [place]

## 2017-05-12 NOTE — Anesthesia Preprocedure Evaluation (Signed)
Anesthesia Evaluation  Patient identified by MRN, date of birth, ID band Patient awake    Reviewed: Allergy & Precautions, NPO status , Patient's Chart, lab work & pertinent test results, reviewed documented beta blocker date and time   Airway Mallampati: II  TM Distance: >3 FB Neck ROM: Full    Dental no notable dental hx.    Pulmonary sleep apnea ,    Pulmonary exam normal breath sounds clear to auscultation       Cardiovascular Pt. on home beta blockers Normal cardiovascular exam+ dysrhythmias  Rhythm:Regular Rate:Normal     Neuro/Psych  Headaches, PSYCHIATRIC DISORDERS TIA   GI/Hepatic Neg liver ROS, hiatal hernia, GERD  ,  Endo/Other  Morbid obesity  Renal/GU negative Renal ROS  negative genitourinary   Musculoskeletal  (+) Arthritis ,   Abdominal (+) + obese,   Peds negative pediatric ROS (+)  Hematology  (+) anemia ,   Anesthesia Other Findings   Reproductive/Obstetrics negative OB ROS                             Anesthesia Physical Anesthesia Plan  ASA: III  Anesthesia Plan: General   Post-op Pain Management:    Induction: Intravenous  Airway Management Planned: Oral ETT  Additional Equipment:   Intra-op Plan:   Post-operative Plan: Extubation in OR  Informed Consent: I have reviewed the patients History and Physical, chart, labs and discussed the procedure including the risks, benefits and alternatives for the proposed anesthesia with the patient or authorized representative who has indicated his/her understanding and acceptance.   Dental advisory given  Plan Discussed with: CRNA  Anesthesia Plan Comments:         Anesthesia Quick Evaluation

## 2017-05-13 ENCOUNTER — Ambulatory Visit: Payer: Medicare Other | Admitting: Psychiatry

## 2017-05-13 ENCOUNTER — Encounter (HOSPITAL_COMMUNITY): Payer: Self-pay | Admitting: *Deleted

## 2017-05-13 LAB — CBC WITH DIFFERENTIAL/PLATELET
BASOS ABS: 0 10*3/uL (ref 0.0–0.1)
Basophils Relative: 0 %
EOS ABS: 0 10*3/uL (ref 0.0–0.7)
EOS PCT: 0 %
HCT: 36.7 % (ref 36.0–46.0)
Hemoglobin: 12 g/dL (ref 12.0–15.0)
LYMPHS PCT: 13 %
Lymphs Abs: 1.3 10*3/uL (ref 0.7–4.0)
MCH: 29 pg (ref 26.0–34.0)
MCHC: 32.7 g/dL (ref 30.0–36.0)
MCV: 88.6 fL (ref 78.0–100.0)
Monocytes Absolute: 0.7 10*3/uL (ref 0.1–1.0)
Monocytes Relative: 7 %
NEUTROS PCT: 80 %
Neutro Abs: 8 10*3/uL — ABNORMAL HIGH (ref 1.7–7.7)
PLATELETS: 339 10*3/uL (ref 150–400)
RBC: 4.14 MIL/uL (ref 3.87–5.11)
RDW: 14.4 % (ref 11.5–15.5)
WBC: 10.1 10*3/uL (ref 4.0–10.5)

## 2017-05-13 MED ORDER — ZOLPIDEM TARTRATE 5 MG PO TABS
5.0000 mg | ORAL_TABLET | Freq: Every evening | ORAL | Status: DC | PRN
  Administered 2017-05-13: 5 mg via ORAL
  Filled 2017-05-13: qty 1

## 2017-05-13 NOTE — Care Management Note (Signed)
Case Management Note  Patient Details  Name: Pamela Pope MRN: [id number] Date of Birth: 01-Jan-1966  Subjective/Objective:                  Two suture posterior repair of hiatua hernia, Laparoscopic sleeve gastrectomy and upper endoscopy  Action/Plan: Date:  May 13, 2017  Chart reviewed for concurrent status and case management needs.  Will continue to follow patient progress.  Discharge Planning: following for needs  Expected discharge date: [id number]  Velva Harman, BSN, [place], [place]   Expected Discharge Date:                  Expected Discharge Plan:  Home/Self Care  In-House Referral:     Discharge planning Services  CM Consult  Post Acute Care Choice:    Choice offered to:     DME Arranged:    DME Agency:     HH Arranged:    HH Agency:     Status of Service:  In process, will continue to follow  If discussed at Long Length of Stay Meetings, dates discussed:    Additional Comments:  Leeroy Cha, RN 05/13/2017, 9:22 AM

## 2017-05-13 NOTE — Progress Notes (Signed)
Patient alert and oriented, Post op day 1.  Provided support and encouragement.  Encouraged pulmonary toilet and ambulation.  Patient completed 12 ounces of clear fluid overnight and has started protein shakes.  She has been encouraged to sip protein slowly taking a break if full feeling occurs.   All questions answered.  Will continue to monitor.

## 2017-05-13 NOTE — Progress Notes (Signed)
Patient ID: Pamela Pope, female   DOB: [birth year], 51 y.o.   MRN: [id number] [facility] Progress Note:   1 Day Post-Op  Subjective: Mental status is clear.  No complaints except gas in shoulder Objective: Vital signs in last 24 hours: Temp:  [97.9 F (36.6 C)-98.2 F (36.8 C)] 98.2 F (36.8 C) (06/05 1400) Pulse Rate:  [79-95] 79 (06/05 1400) Resp:  [14-16] 16 (06/05 0448) BP: (140-155)/(68-95) 141/81 (06/05 1400) SpO2:  [94 %-98 %] 97 % (06/05 1400) Weight:  [113.4 kg (250 lb)] 113.4 kg (250 lb) (06/05 0833)  Intake/Output from previous day: 06/04 0701 - 06/05 0700 In: 3511.7 [P.O.:390; I.V.:3121.7] Out: 350 [Urine:300; Blood:50] Intake/Output this shift: Total I/O In: 90 [P.O.:90] Out: -   Physical Exam: Work of breathing is normal.  Anticipated degree of abdominal pain  Lab Results:  Results for orders placed or performed during the hospital encounter of 05/12/17 (from the past 48 hour(s))  CBC     Status: Abnormal   Collection Time: 05/12/17  5:30 PM  Result Value Ref Range   WBC 13.3 (H) 4.0 - 10.5 K/uL   RBC 4.14 3.87 - 5.11 MIL/uL   Hemoglobin 12.1 12.0 - 15.0 g/dL   HCT 36.7 36.0 - 46.0 %   MCV 88.6 78.0 - 100.0 fL   MCH 29.2 26.0 - 34.0 pg   MCHC 33.0 30.0 - 36.0 g/dL   RDW 14.5 11.5 - 15.5 %   Platelets 311 150 - 400 K/uL  Creatinine, serum     Status: None   Collection Time: 05/12/17  5:30 PM  Result Value Ref Range   Creatinine, Ser 0.96 0.44 - 1.00 mg/dL   GFR calc non Af Amer >60 >60 mL/min   GFR calc Af Amer >60 >60 mL/min    Comment: (NOTE) The eGFR has been calculated using the CKD EPI equation. This calculation has not been validated in all clinical situations. eGFR's persistently <60 mL/min signify possible Chronic Kidney Disease.   CBC WITH DIFFERENTIAL     Status: Abnormal   Collection Time: 05/13/17  7:10 AM  Result Value Ref Range   WBC 10.1 4.0 - 10.5 K/uL   RBC 4.14 3.87 - 5.11 MIL/uL   Hemoglobin 12.0 12.0 - 15.0  g/dL   HCT 36.7 36.0 - 46.0 %   MCV 88.6 78.0 - 100.0 fL   MCH 29.0 26.0 - 34.0 pg   MCHC 32.7 30.0 - 36.0 g/dL   RDW 14.4 11.5 - 15.5 %   Platelets 339 150 - 400 K/uL   Neutrophils Relative % 80 %   Neutro Abs 8.0 (H) 1.7 - 7.7 K/uL   Lymphocytes Relative 13 %   Lymphs Abs 1.3 0.7 - 4.0 K/uL   Monocytes Relative 7 %   Monocytes Absolute 0.7 0.1 - 1.0 K/uL   Eosinophils Relative 0 %   Eosinophils Absolute 0.0 0.0 - 0.7 K/uL   Basophils Relative 0 %   Basophils Absolute 0.0 0.0 - 0.1 K/uL    Radiology/Results: No results found.  Anti-infectives: Anti-infectives    Start     Dose/Rate Route Frequency Ordered Stop   05/12/17 1030  ciprofloxacin (CIPRO) IVPB 400 mg     400 mg 200 mL/hr over 60 Minutes Intravenous  Once 05/12/17 1024 05/12/17 1227   05/12/17 1030  ciprofloxacin (CIPRO) 400 MG/200ML IVPB    Comments:  Tamera Punt, [place]   : cabinet override      05/12/17 1030 05/12/17 2244   05/12/17  1121  cefoTEtan in Dextrose 5% (CEFOTAN) IVPB 2 g  Status:  Discontinued     2 g Intravenous On call to O.R. 05/12/17 0855 05/12/17 1038      Assessment/Plan: Problem List: Patient Active Problem List   Diagnosis Date Noted  . 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  Hopeful discharge tomorrow 1 Day Post-Op    LOS: 1 day   Matt B. Hassell Done, MD, [facility] [phone] beeper [phone]  05/13/2017 4:45 PM

## 2017-05-13 NOTE — Progress Notes (Signed)
Patient alert and oriented, pain is controlled. Patient is tolerating fluids, advanced to protein shake today, patient is tolerating well.  Reviewed Gastric sleeve discharge instructions with patient and patient is able to articulate understanding.  Provided information on BELT program, Support Group and [facility] outpatient pharmacy. All questions answered, will continue to monitor.  

## 2017-05-13 NOTE — Progress Notes (Signed)
Nutrition Education Note  Received consult for diet education per DROP protocol.   Discussed 2 week post op diet with pt. Emphasized that liquids must be non carbonated, non caffeinated, and sugar free. Fluid goals discussed. Pt to follow up with outpatient bariatric RD for further diet progression after 2 weeks. Multivitamins and minerals also reviewed. Teach back method used, pt expressed understanding, expect good compliance.  Pt feels good today. Complains of increased amount of gas. Pt has had 6oz of protein since 8am this morning and so far is tolerating well. Pt plans to have multivitamins by tomorrow. Pt plans to attend education class in two weeks. Pt reports that she comfortable moving forward with the next two weeks.   Diet: First 2 Weeks  You will see the nutritionist about two (2) weeks after your surgery. The nutritionist will increase the types of foods you can eat if you are handling liquids well:  If you have severe vomiting or nausea and cannot handle clear liquids lasting longer than 1 day, call your surgeon  Protein Shake  Drink at least 2 ounces of shake 5-6 times per day  Each serving of protein shakes (usually 8 - 12 ounces) should have a minimum of:  15 grams of protein  And no more than 5 grams of carbohydrate  Goal for protein each day:  Men = 80 grams per day  Women = 60 grams per day  Protein powder may be added to fluids such as non-fat milk or Lactaid milk or Soy milk (limit to 35 grams added protein powder per serving)   Hydration  Slowly increase the amount of water and other clear liquids as tolerated (See Acceptable Fluids)  Slowly increase the amount of protein shake as tolerated  Sip fluids slowly and throughout the day  May use sugar substitutes in small amounts (no more than 6 - 8 packets per day; i.e. Splenda)   Fluid Goal  The first goal is to drink at least 8 ounces of protein shake/drink per day (or as directed by the nutritionist); some examples  of protein shakes are Premier Protein, [employer], [employer]R [employer], EAS Edge HP, and [place]. See handout from pre-op Bariatric Education Class:  Slowly increase the amount of protein shake you drink as tolerated  You may find it easier to slowly sip shakes throughout the day  It is important to get your proteins in first  Your fluid goal is to drink 64 - 100 ounces of fluid daily  It may take a few weeks to build up to this  32 oz (or more) should be clear liquids  And  32 oz (or more) should be full liquids (see below for examples)  Liquids should not contain sugar, caffeine, or carbonation   Clear Liquids:  Water or Sugar-free flavored water (i.e. Fruit H2O, Propel)  Decaffeinated coffee or tea (sugar-free)  Crystal Lite, Wyler's Lite, Minute Maid Lite  Sugar-free Jell-O  Bouillon or broth  Sugar-free Popsicle: *Less than 20 calories each; Limit 1 per day   Full Liquids:  Protein Shakes/Drinks + 2 choices per day of other full liquids  Full liquids must be:  No More Than 12 grams of Carbs per serving  No More Than 3 grams of Fat per serving  Strained low-fat cream soup  Non-Fat milk  Fat-free Lactaid Milk  Sugar-free yogurt (Dannon Lite & Fit, [place] yogurt, Oikos Zero)   Koleen Distance MS, RD, LDN Pager #- [phone]

## 2017-05-14 LAB — CBC WITH DIFFERENTIAL/PLATELET
Basophils Absolute: 0 10*3/uL (ref 0.0–0.1)
Basophils Relative: 0 %
EOS ABS: 0.1 10*3/uL (ref 0.0–0.7)
Eosinophils Relative: 1 %
HEMATOCRIT: 38.6 % (ref 36.0–46.0)
HEMOGLOBIN: 12.4 g/dL (ref 12.0–15.0)
LYMPHS ABS: 2.3 10*3/uL (ref 0.7–4.0)
Lymphocytes Relative: 19 %
MCH: 29 pg (ref 26.0–34.0)
MCHC: 32.1 g/dL (ref 30.0–36.0)
MCV: 90.2 fL (ref 78.0–100.0)
MONO ABS: 0.9 10*3/uL (ref 0.1–1.0)
MONOS PCT: 8 %
NEUTROS PCT: 72 %
Neutro Abs: 8.5 10*3/uL — ABNORMAL HIGH (ref 1.7–7.7)
Platelets: 361 10*3/uL (ref 150–400)
RBC: 4.28 MIL/uL (ref 3.87–5.11)
RDW: 14.7 % (ref 11.5–15.5)
WBC: 11.7 10*3/uL — ABNORMAL HIGH (ref 4.0–10.5)

## 2017-05-14 MED ORDER — DIPHENHYDRAMINE HCL 25 MG PO CAPS
25.0000 mg | ORAL_CAPSULE | ORAL | Status: DC | PRN
  Administered 2017-05-14: 25 mg via ORAL
  Filled 2017-05-14: qty 1

## 2017-05-14 NOTE — Progress Notes (Signed)
Pt with complaints of itching and rash on feet related to the whey in protein shakes requesting PO benadryl. Order received from Dr. Zella Richer

## 2017-05-14 NOTE — Discharge Summary (Signed)
Physician Discharge Summary  Patient ID: Chessica Audia MRN: [id number] DOB/AGE: 51/19/1967 51 y.o.  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  Begun on clears and advanced and ready for discharge on PD 2.  Incisions OK  Consults: none  Significant Diagnostic Studies: none    Discharge Exam: Blood pressure 117/80, pulse 100, temperature 97.7 F (36.5 C), temperature source Oral, resp. rate 18, height 5\' 3"  (1.6 m), weight 112.3 kg (247 lb 8 oz), SpO2 95 %. Incisions ok.    Disposition: Final discharge disposition not confirmed  Discharge Instructions    Ambulate hourly while awake    Complete by:  As directed    Call MD for:  difficulty breathing, headache or visual disturbances    Complete by:  As directed    Call MD for:  persistant dizziness or light-headedness    Complete by:  As directed    Call MD for:  persistant nausea and vomiting    Complete by:  As directed    Call MD for:  redness, tenderness, or signs of infection (pain, swelling, redness, odor or green/yellow discharge around incision site)    Complete by:  As directed    Call MD for:  severe uncontrolled pain    Complete by:  As directed    Call MD for:  temperature >101 F    Complete by:  As directed    Diet bariatric full liquid    Complete by:  As directed    Incentive spirometry    Complete by:  As directed    Perform hourly while awake     Allergies as of 05/14/2017      Reactions   Cefuroxime Axetil Shortness Of Breath   Lip swelling   Penicillins Shortness Of Breath   Lip blister Has patient had a PCN reaction causing immediate rash, facial/tongue/throat swelling, SOB or lightheadedness with hypotension: Yes Has patient  had a PCN reaction causing severe rash involving mucus membranes or skin necrosis: Yes Has patient had a PCN reaction that required hospitalization: Yes Has patient had a PCN reaction occurring within the last 10 years: Yes If all of the above answers are "NO", then may proceed with Cephalosporin use.   Sulfur Shortness Of Breath   Latex Hives   Lubiprostone    unknown   Other    Protein shakes with whey causes itching and diarrhea      Medication List    STOP taking these medications   traMADol 50 MG tablet Commonly known as:  ULTRAM     TAKE these medications   CALCIUM PO Take 1 tablet by mouth daily.   carvedilol 25 MG tablet Commonly known as:  COREG Take 50 mg by mouth 2 (two) times daily. Notes to patient:  Monitor Blood Pressure Daily and keep a log for primary care physician.  You may need to make changes to your medications with rapid weight loss.     cetirizine 10 MG tablet Commonly known as:  ZYRTEC Take 10 mg by mouth daily.   cyclobenzaprine 10 MG tablet Commonly known as:  FLEXERIL Take 10 mg by mouth 3 (three) times daily as needed for muscle spasms.   escitalopram 20 MG tablet Commonly known as:  LEXAPRO  Take 20 mg by mouth daily.   estrogens (conjugated) 0.9 MG tablet Commonly known as:  PREMARIN Take 0.9 mg by mouth daily.   EVZIO 2 MG/0.4ML Soaj Generic drug:  Naloxone HCl Inject 2 mg as directed daily as needed (opioid  emergency).   midodrine 5 MG tablet Commonly known as:  PROAMATINE Take 5 mg by mouth 3 (three) times daily as needed (low blood pressure). If bp is 90/60 or lower Notes to patient:  Monitor Blood Pressure Daily and keep a log for primary care physician.  You may need to make changes to your medications with rapid weight loss.     multivitamin with minerals Tabs tablet Take 1 tablet by mouth daily.   nortriptyline 50 MG capsule Commonly known as:  PAMELOR Take 50 mg by mouth at bedtime.   ondansetron 4 MG tablet Commonly  known as:  ZOFRAN Take 4 mg by mouth every 8 (eight) hours as needed for nausea or vomiting.   RABEprazole 20 MG tablet Commonly known as:  ACIPHEX Take 20 mg by mouth daily.   SUMAtriptan 6 MG/0.5ML Soln injection Commonly known as:  IMITREX Inject 6 mg into the skin every 2 (two) hours as needed for migraine or headache. May repeat in 2 hours if headache persists or recurs.   SUMAtriptan 100 MG tablet Commonly known as:  IMITREX Take 100 mg by mouth every 2 (two) hours as needed for migraine. May repeat in 2 hours if headache persists or recurs.   VISINE OP Apply 1 drop to eye daily as needed (dry eyes).   zolpidem 5 MG tablet Commonly known as:  AMBIEN Take 5 mg by mouth at bedtime.      Follow-up Information    Johnathan Hausen, MD. Go on 06/06/2017.   Specialty:  General Surgery Why:  at [place] information: [address] [place] [address] [phone]        Johnathan Hausen, MD Follow up.   Specialty:  General Surgery Contact information: [place] [phone]           Signed: Pedro Earls 05/14/2017, 8:25 AM

## 2017-05-14 NOTE — Progress Notes (Signed)
Pt's vitals are WNL, tolerating diet and pain is under control. Discussed discharge instructions with patient. Patient was discharged to home.

## 2017-05-14 NOTE — Progress Notes (Signed)
IV infiltrated. Called Dr. Zella Richer, stated we can leave it out for now and to notify morning MD. Pt tolerating full protein shake starting on another, likely for DC this am.

## 2017-05-14 NOTE — Progress Notes (Signed)
Date: May 14, 2017  Discharge orders review for case management needs.  None found  Velva Harman, BSN, [place], [place]:  [phone]

## 2017-05-14 NOTE — Progress Notes (Addendum)
Patient alert and oriented, Post op day 2.  Provided support and encouragement.  Encouraged pulmonary toilet, ambulation and small sips of liquids.  Patient has met protein required without pain or nausea. All questions answered.  Will continue to monitor.

## 2017-05-15 ENCOUNTER — Ambulatory Visit: Payer: Self-pay | Admitting: Registered"

## 2017-05-22 ENCOUNTER — Telehealth (HOSPITAL_COMMUNITY): Payer: Self-pay

## 2017-05-22 NOTE — Telephone Encounter (Addendum)
Made discharge phone call to patient. Asking the following questions.    1. Do you have someone to care for you now that you are home?  yes 2. Are you having pain now that is not relieved by your pain medication? no 3. Are you able to drink the recommended daily amount of fluids (48 ounces minimum/day) and protein (60-80 grams/day) as prescribed by the dietitian or nutritional counselor?  40 ounces of fluid and 30 grams of protein 4. Are you taking the vitamins and minerals as prescribed?  yes 5. Do you have the "on call" number to contact your surgeon if you have a problem or question?  yes 6. Are your incisions free of redness, swelling or drainage? (If steri strips, address that these can fall off, shower as tolerated)  yes 7. Have your bowels moved since your surgery?  If not, are you passing gas?  yes 8. Are you up and walking 3-4 times per day?  yes 9. Were you provided your discharge medications before your surgery or before you were discharged from the hospital and are you taking them without problem?  yes

## 2017-05-27 ENCOUNTER — Encounter: Payer: Self-pay | Admitting: Skilled Nursing Facility1

## 2017-05-27 ENCOUNTER — Encounter: Payer: Medicare Other | Attending: Surgery | Admitting: Skilled Nursing Facility1

## 2017-05-27 DIAGNOSIS — Z6841 Body Mass Index (BMI) 40.0 and over, adult: Secondary | ICD-10-CM | POA: Insufficient documentation

## 2017-05-27 DIAGNOSIS — E669 Obesity, unspecified: Secondary | ICD-10-CM

## 2017-05-27 DIAGNOSIS — Z79899 Other long term (current) drug therapy: Secondary | ICD-10-CM | POA: Diagnosis not present

## 2017-05-27 DIAGNOSIS — Z85528 Personal history of other malignant neoplasm of kidney: Secondary | ICD-10-CM | POA: Diagnosis not present

## 2017-05-27 DIAGNOSIS — G4730 Sleep apnea, unspecified: Secondary | ICD-10-CM | POA: Diagnosis not present

## 2017-05-27 DIAGNOSIS — Z905 Acquired absence of kidney: Secondary | ICD-10-CM | POA: Insufficient documentation

## 2017-05-27 DIAGNOSIS — K219 Gastro-esophageal reflux disease without esophagitis: Secondary | ICD-10-CM | POA: Insufficient documentation

## 2017-05-27 DIAGNOSIS — Z713 Dietary counseling and surveillance: Secondary | ICD-10-CM | POA: Insufficient documentation

## 2017-05-27 DIAGNOSIS — Z8673 Personal history of transient ischemic attack (TIA), and cerebral infarction without residual deficits: Secondary | ICD-10-CM | POA: Insufficient documentation

## 2017-05-27 NOTE — Progress Notes (Signed)
Bariatric Class:  Appt start time: 1530 end time:  1630.  2 Week Post-Operative Nutrition Class  Patient was seen on 05/27/2017 for Post[facility]ative Nutrition [facility].   Surgery date: 05/12/2017 Surgery type: sleeve Start weight at [facility]: 261.9 Weight today: 236  TANITA  BODY COMP RESULTS  05/27/2017   BMI (kg/m^2) 41.8   Fat Mass (lbs) 124.6   Fat Free Mass (lbs) 111.4   Total Body Water (lbs) 81.2   The following the learning objectives were met by the patient during this course:  Identifies Phase 3A (Soft, High Proteins) Dietary Goals and will begin from 2 weeks post-operatively to 2 months post-operatively  Identifies appropriate sources of fluids and proteins   States protein recommendations and appropriate sources post-operatively  Identifies the need for appropriate texture modifications, mastication, and bite sizes when consuming solids  Identifies appropriate multivitamin and calcium sources post-operatively  Describes the need for physical activity post-operatively and will follow MD recommendations  States when to call healthcare provider regarding medication questions or post-operative complications  Handouts given during class include:  Phase 3A: Soft, High Protein Diet Handout  Follow-Up Plan: Patient will follow-up at [facility] in 6 weeks for 2 month post-op nutrition visit for diet advancement per MD.    

## 2017-07-08 ENCOUNTER — Encounter: Payer: Self-pay | Admitting: Registered"

## 2017-07-08 ENCOUNTER — Encounter: Payer: Medicare Other | Attending: Surgery | Admitting: Registered"

## 2017-07-08 DIAGNOSIS — Z8673 Personal history of transient ischemic attack (TIA), and cerebral infarction without residual deficits: Secondary | ICD-10-CM | POA: Diagnosis not present

## 2017-07-08 DIAGNOSIS — Z905 Acquired absence of kidney: Secondary | ICD-10-CM | POA: Insufficient documentation

## 2017-07-08 DIAGNOSIS — Z713 Dietary counseling and surveillance: Secondary | ICD-10-CM | POA: Insufficient documentation

## 2017-07-08 DIAGNOSIS — Z79899 Other long term (current) drug therapy: Secondary | ICD-10-CM | POA: Insufficient documentation

## 2017-07-08 DIAGNOSIS — G4730 Sleep apnea, unspecified: Secondary | ICD-10-CM | POA: Insufficient documentation

## 2017-07-08 DIAGNOSIS — K219 Gastro-esophageal reflux disease without esophagitis: Secondary | ICD-10-CM | POA: Insufficient documentation

## 2017-07-08 DIAGNOSIS — Z85528 Personal history of other malignant neoplasm of kidney: Secondary | ICD-10-CM | POA: Diagnosis not present

## 2017-07-08 DIAGNOSIS — Z6841 Body Mass Index (BMI) 40.0 and over, adult: Secondary | ICD-10-CM | POA: Diagnosis not present

## 2017-07-08 DIAGNOSIS — E669 Obesity, unspecified: Secondary | ICD-10-CM

## 2017-07-08 NOTE — Patient Instructions (Addendum)
-   Remember to keep track of food and drink intake using MyFitnessPal  - Other bariatric-specific multivitamin options:       Bariatric Advantage Advanced Multi EA [place] or online)      Opurity: Bypass and Sleeve Optimized Chewable (online only)  - Increase fluid intake to 64 oz/day: Decaf tea, sugar-free popsicles, sugar-free jello, try Premier Protein clear drink   - Aim to take 3 Ca supplements per day using schedule given at hospital discharge.

## 2017-07-08 NOTE — Progress Notes (Signed)
Follow-up visit:  8 Weeks Post-Operative Sleeve Gastrectomy Surgery  Medical Nutrition Therapy:  Appt start time: 4:15 end time:  4:52  Primary concerns today: Post-operative Bariatric Surgery Nutrition Management.  Non scale victories: none stated  Surgery date: 05/12/2017 Surgery type: Sleeve gastrectomy Start weight at [facility]: 261.9 lbs Weight today: 221.4 lbs Weight change: 14.6 lbs from 236 (05/27/2017) Total weight lost: 40.5 lbs Weight loss goal: less than 200 lbs; ultimately 150-160 lbs  TANITA  BODY COMP RESULTS  05/27/2017 07/08/2017   BMI (kg/m^2) 41.8 39.2   Fat Mass (lbs) 124.6 111.8   Fat Free Mass (lbs) 111.4 109.6   Total Body Water (lbs) 81.2 79.4    Pt states she has 2 crackers with peanut butter as her daily treat; handles well. Pt states she has increased her walking on treadmill up to 18 min. Pt states she has tried broccoli and some watermelon; handles well. Pt states she is having trouble chewing vitamins and drinking shakes due to taste and sometimes forgetting. Pt states they lost their son to a car accident 2 weeks ago and also had a new granddaughter born in June. Pt states she is not a big drinker and having issues meeting her fluid needs. Pt states her stomach feels bloated as day progresses. Pt is not meeting protein and fluid needs. Pt reports taking 1 Ca supplement per day.   Preferred Learning Style:   No preference indicated   Learning Readiness:   Contemplating  Ready  Change in progress  24-hr recall: B (AM): 1/2 protein shake (15g) or 1 egg (7g) Snk (AM): sometimes cheese and ham (14g) L (PM): tuna (7g) Snk (PM): 2 crackers with peanut butter (8g)  D (PM): steak, chicken, shrimp, hot dog, onion, bell peppers (14g) Snk (PM): none  Fluid intake: 1/2 protein, water; 40 oz Estimated total protein intake: about 44g protein   Medications: See list Supplementation: Celebrate mult +1 Ca supp  Using straws: no Drinking while eating:  no Having you been chewing well: yes Chewing/swallowing difficulties: no Changes in vision: no Changes to mood/headaches: no Hair loss/Cahnges to skin/Changes to nails: no, no, no Any difficulty focusing or concentrating: no Sweating: no Dizziness/Lightheaded: due to blood pressure medications, adjustments made Palpitations: tachycardia normally  Carbonated beverages: no N/V/D/C/GAS: no, no, no, yes, yes Abdominal Pain: no Dumping syndrome: no Last Lap-Band fill: N/A  Recent physical activity:  Treadmill 18 min, 5 days/week; strength training 3-4x/week  Progress Towards Goal(s):  In progress.  Handouts given during visit include:  Snack Ideas for Post-op Bariatric Surgery   Nutritional Diagnosis:  Inadequate fluid intake As related to bariatric surgery post-op recommendations.  As evidenced by dietary recall of less than 64 oz fluid daily. NI-5.7.1 Inadequate protein intake As related to bariatric surgery post-op recommendations.  As evidenced by dietary recall of less than 60g protein daily.    Intervention:  Nutrition education and counseling. RD educated and counseled pt on importance of meeting protein and fluid needs daily and ways to improve. Also educated and counseled pt on how to take multivitamin and calcium supplements in sync with meals/snacks.   Goals: - Remember to keep track of food and drink intake using MyFitnessPal - Other bariatric-specific multivitamin options:       Bariatric Advantage Advanced Multi EA ([place]      or online)      Opurity: Bypass and Sleeve Optimized Chewable      (online only) - Increase fluid intake to 64 oz/day: Decaf  tea, sugar-free popsicles, sugar-free jello, try Premier Protein clear drink  - Aim to take 3 Ca supplements per day using schedule given at hospital discharge.  Teaching Method Utilized:  Visual Auditory  Barriers to learning/adherence to lifestyle change: none  Demonstrated degree of understanding via:  Teach  Back   Monitoring/Evaluation:  Dietary intake, exercise, and body weight. Follow up in 2 weeks for 10 week post-op visit.

## 2017-07-24 ENCOUNTER — Encounter: Payer: Medicare Other | Attending: Surgery | Admitting: Registered"

## 2017-07-24 ENCOUNTER — Encounter: Payer: Self-pay | Admitting: Registered"

## 2017-07-24 DIAGNOSIS — Z6841 Body Mass Index (BMI) 40.0 and over, adult: Secondary | ICD-10-CM | POA: Insufficient documentation

## 2017-07-24 DIAGNOSIS — Z8673 Personal history of transient ischemic attack (TIA), and cerebral infarction without residual deficits: Secondary | ICD-10-CM | POA: Insufficient documentation

## 2017-07-24 DIAGNOSIS — Z713 Dietary counseling and surveillance: Secondary | ICD-10-CM | POA: Diagnosis not present

## 2017-07-24 DIAGNOSIS — Z85528 Personal history of other malignant neoplasm of kidney: Secondary | ICD-10-CM | POA: Diagnosis not present

## 2017-07-24 DIAGNOSIS — G4730 Sleep apnea, unspecified: Secondary | ICD-10-CM | POA: Diagnosis not present

## 2017-07-24 DIAGNOSIS — K219 Gastro-esophageal reflux disease without esophagitis: Secondary | ICD-10-CM | POA: Diagnosis not present

## 2017-07-24 DIAGNOSIS — Z905 Acquired absence of kidney: Secondary | ICD-10-CM | POA: Insufficient documentation

## 2017-07-24 DIAGNOSIS — E669 Obesity, unspecified: Secondary | ICD-10-CM

## 2017-07-24 DIAGNOSIS — Z79899 Other long term (current) drug therapy: Secondary | ICD-10-CM | POA: Diagnosis not present

## 2017-07-24 NOTE — Progress Notes (Signed)
Follow-up visit:  8 Weeks Post-Operative Sleeve Gastrectomy Surgery  Medical Nutrition Therapy:  Appt start time: 4:30 end time:  4:50  Primary concerns today: Post-operative Bariatric Surgery Nutrition Management.  Non scale victories: none stated  Surgery date: 05/12/2017 Surgery type: Sleeve gastrectomy Start weight at [facility]: 261.9 lbs Weight today: 215.8 lbs Weight change: 5.6 lbs from 221.4 (07/08/2017) Total weight lost: 46.1 lbs Weight loss goal: less than 200 lbs; ultimately 150-160 lbs  TANITA  BODY COMP RESULTS  05/27/2017 07/08/2017 07/24/2017   BMI (kg/m^2) 41.8 39.2 38.2   Fat Mass (lbs) 124.6 111.8 108.6   Fat Free Mass (lbs) 111.4 109.6 107.2   Total Body Water (lbs) 81.2 79.4 77.6    Pt states she is tracking food using [employer] sometimes and recording amounts. Pt has increased protein intake from previous visit and currently meeting daily needs.   Pt states she has 2 crackers with peanut butter as her daily treat; handles well. Pt states she has increased her walking on treadmill up to 18 min. Pt states she has tried broccoli and some watermelon; handles well. Pt states she is having trouble chewing vitamins and drinking shakes due to taste and sometimes forgetting. Pt states they lost their son to a car accident 2 weeks ago and also had a new granddaughter born in June. Pt states she is not a big drinker and having issues meeting her fluid needs. Pt states her stomach feels bloated as day progresses. Pt reports taking 1 Ca supplement per day.   Preferred Learning Style:   No preference indicated   Learning Readiness:   Contemplating  Ready  Change in progress  24-hr recall: B (AM): 1/2 protein shake (15g) or 1 egg (7g) and cheese (6g) or sausage (7g) Snk (AM): [employer] buffalo bites (14g) L (PM): tuna (7g) Snk (PM): cheese (7g)  D (PM): [employer]-grilled fish (21g) Snk (PM): peanut butter crackers, sugar free jello, sugar free popsicles  Fluid  intake: 1/2 protein shake (5.5 oz), water (34 oz), sugar free jello (3 oz), sugar-free popsicles (1.5 oz); 44 oz Estimated total protein intake: 60+ grams    Medications: See list Supplementation: 1 Celebrate mult +1 Ca supp  Using straws: no Drinking while eating: no Having you been chewing well: yes Chewing/swallowing difficulties: no Changes in vision: no Changes to mood/headaches: no Hair loss/Chhnges to skin/Changes to nails: no, no, no Any difficulty focusing or concentrating: no Sweating: no Dizziness/Lightheaded: due to blood pressure medications, adjustments made Palpitations: tachycardia normally  Carbonated beverages: no N/V/D/C/GAS: no, no, no, yes, yes Abdominal Pain: no Dumping syndrome: no Last Lap-Band fill: N/A  Recent physical activity:  Treadmill 18 min, 5 days/week; strength training 3-4x/week  Progress Towards Goal(s):  In progress.  Handouts given during visit include:  Snack Ideas for Post-op Bariatric Surgery   Nutritional Diagnosis:  Inadequate fluid intake As related to bariatric surgery post-op recommendations.  As evidenced by dietary recall of less than 64 oz fluid daily.    Intervention:  Nutrition education and counseling. RD educated and counseled pt on importance of meeting protein and fluid needs daily and ways to continue to improve. Also educated and counseled pt on how to take multivitamin and calcium supplements in sync with meals/snacks.   Goals:  Follow Phase 3B: High Protein + Non-Starchy Vegetables  Eat 3-6 small meals/snacks, every 3-5 hrs  Increase lean protein foods to meet 60g goal  Increase fluid intake to 64oz +  Avoid drinking 15 minutes before, during and  30 minutes after eating  Aim for >30 min of physical activity daily   Teaching Method Utilized:  Visual Auditory  Barriers to learning/adherence to lifestyle change: none  Demonstrated degree of understanding via:  Teach Back   Monitoring/Evaluation:   Dietary intake, exercise, and body weight. Follow up in 4 months for 6 months post-op visit.

## 2017-07-24 NOTE — Patient Instructions (Signed)
Goals:  Follow Phase 3B: High Protein + Non-Starchy Vegetables  Eat 3-6 small meals/snacks, every 3-5 hrs  Increase lean protein foods to meet 60g goal  Increase fluid intake to 64oz +  Avoid drinking 15 minutes before, during and 30 minutes after eating  Aim for >30 min of physical activity daily  

## 2017-11-27 ENCOUNTER — Ambulatory Visit: Payer: Self-pay | Admitting: Registered"

## 2018-03-20 ENCOUNTER — Encounter (HOSPITAL_COMMUNITY): Payer: Self-pay

## 2018-08-08 IMAGING — CR DG CHEST 2V
2 series · 2 of 2 positions shown · non-contrast
Comparison: No recent prior.

CLINICAL DATA: Bariatric surgery.

EXAM:
CHEST  2 VIEW

[w chest pa]
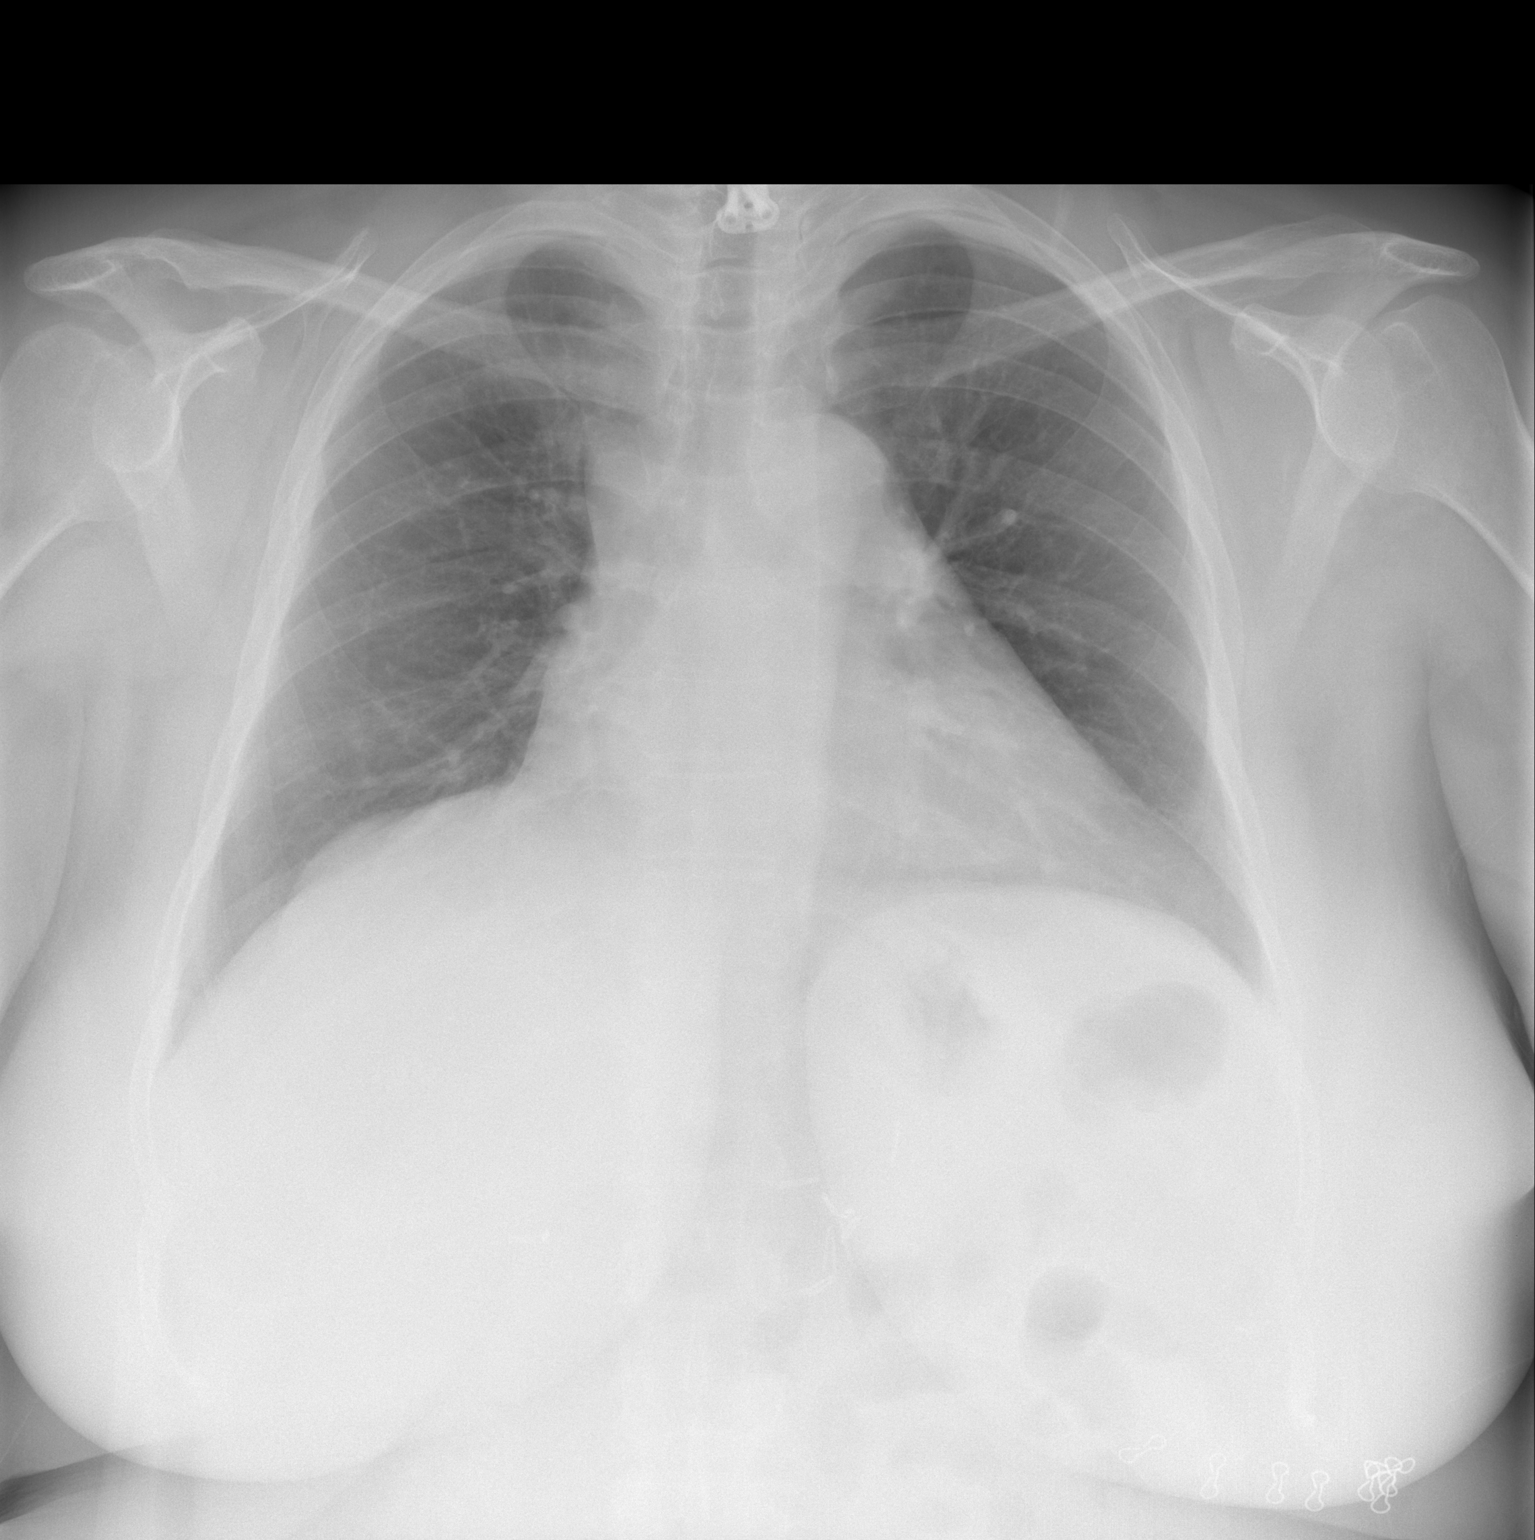

[w chest lat]
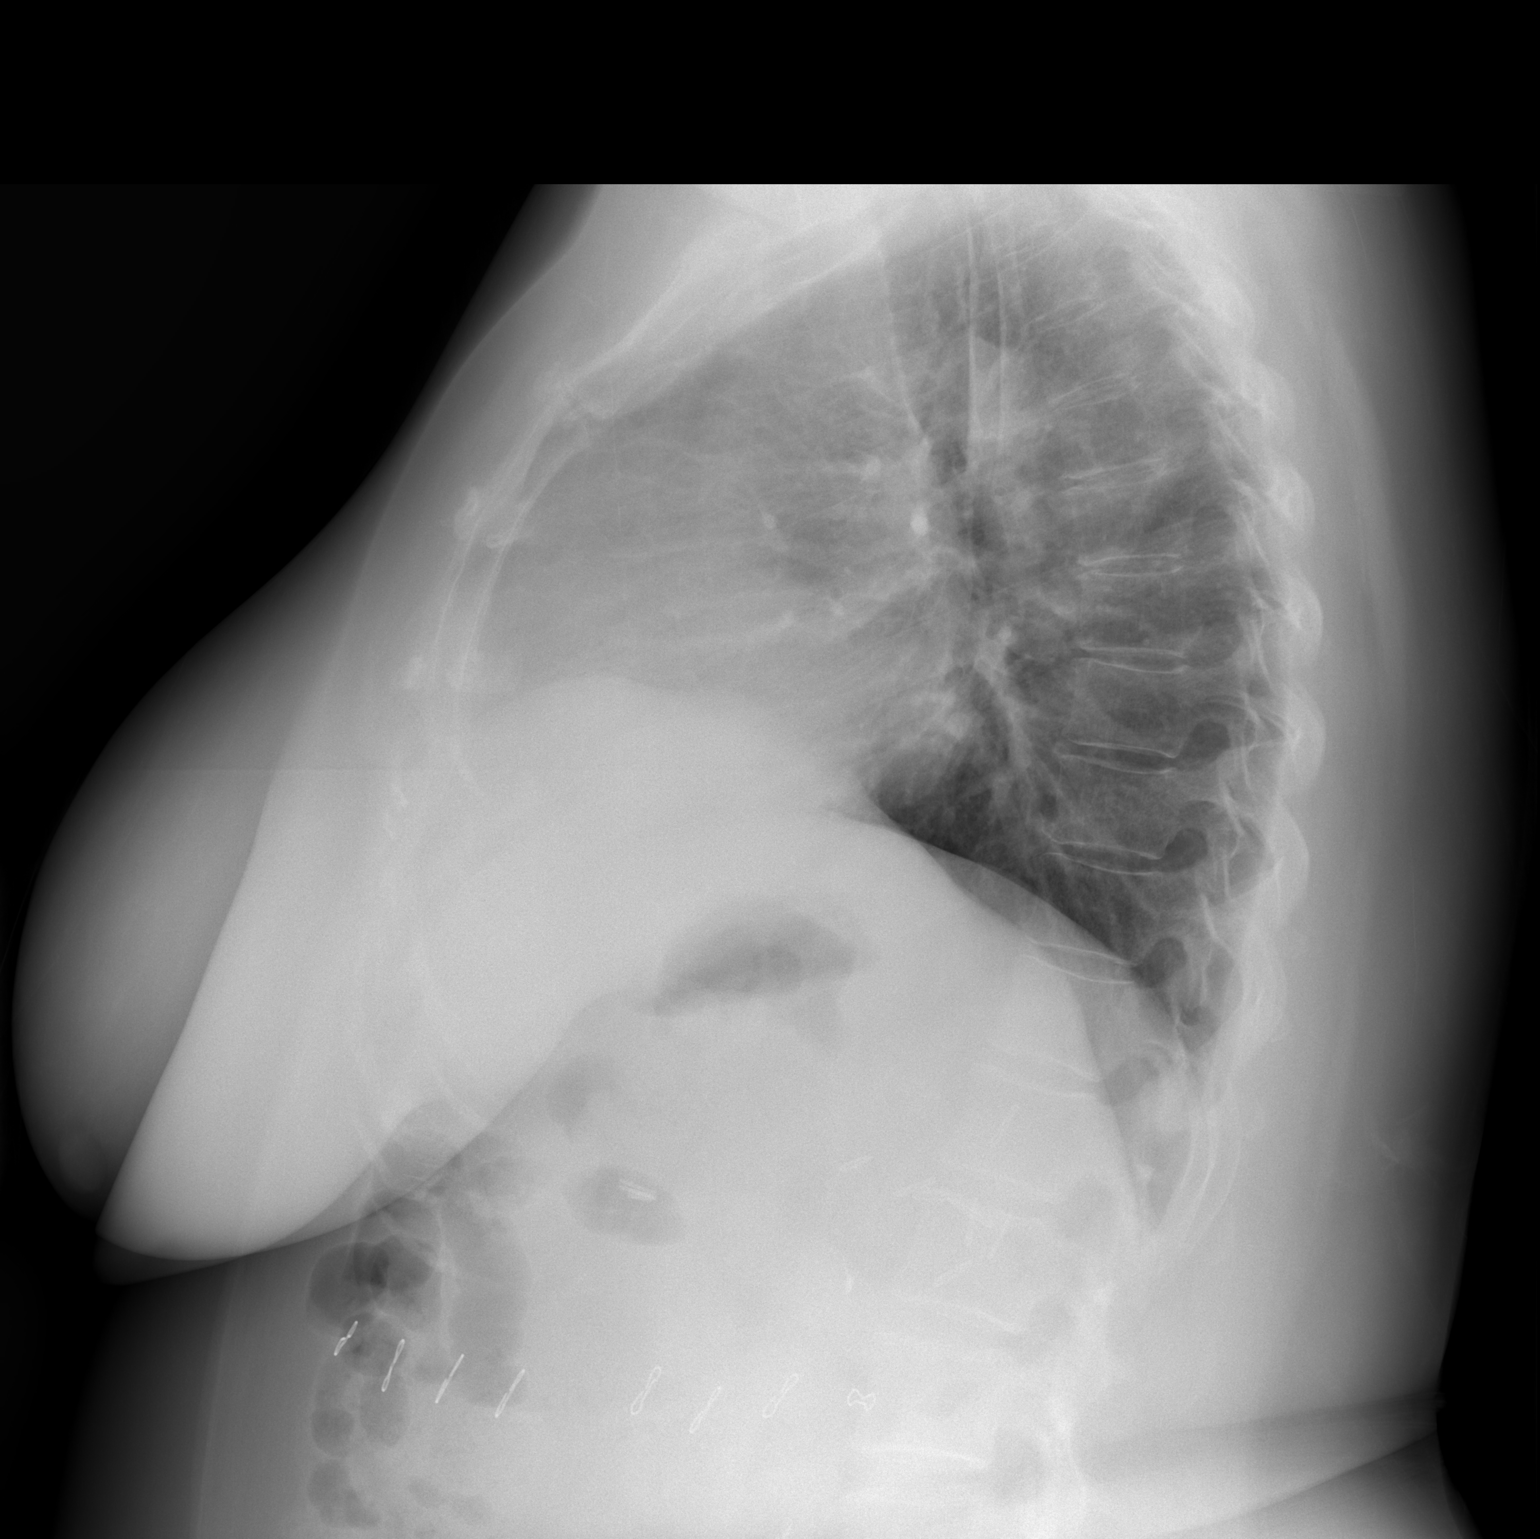

[2 of 2 positions shown; findings below may reference images not displayed]

FINDINGS: Cardiomegaly with normal pulmonary vascularity. No focal pulmonary
infiltrate. No pleural effusion or pneumothorax. Prior cervical
spine fusion.
IMPRESSION: Cardiomegaly.  No pulmonary venous congestion.  No focal infiltrate.

## 2018-12-09 DEATH — deceased

## 2019-11-01 ENCOUNTER — Encounter (HOSPITAL_COMMUNITY): Payer: Self-pay

## 2020-11-27 ENCOUNTER — Encounter (HOSPITAL_COMMUNITY): Payer: Self-pay
